# Patient Record
Sex: Female | Born: 1945 | Race: White | Hispanic: No | Smoking: Former smoker
Health system: Southern US, Community
[De-identification: ages and names within clinical notes are randomized; demographics above are authoritative.]

## PROBLEM LIST (undated history)

## (undated) ENCOUNTER — Ambulatory Visit: Admission: EM | Payer: Medicare Other | Source: Home / Self Care

## (undated) DIAGNOSIS — R51 Headache: Secondary | ICD-10-CM

## (undated) DIAGNOSIS — K219 Gastro-esophageal reflux disease without esophagitis: Secondary | ICD-10-CM

## (undated) DIAGNOSIS — F32A Depression, unspecified: Secondary | ICD-10-CM

## (undated) DIAGNOSIS — M1990 Unspecified osteoarthritis, unspecified site: Secondary | ICD-10-CM

## (undated) DIAGNOSIS — R519 Headache, unspecified: Secondary | ICD-10-CM

## (undated) DIAGNOSIS — E785 Hyperlipidemia, unspecified: Secondary | ICD-10-CM

## (undated) DIAGNOSIS — R110 Nausea: Secondary | ICD-10-CM

## (undated) DIAGNOSIS — F329 Major depressive disorder, single episode, unspecified: Secondary | ICD-10-CM

## (undated) DIAGNOSIS — D649 Anemia, unspecified: Secondary | ICD-10-CM

## (undated) HISTORY — PX: BLADDER SURGERY: SHX569

## (undated) HISTORY — PX: BREAST SURGERY: SHX581

## (undated) HISTORY — PX: TOTAL ABDOMINAL HYSTERECTOMY: SHX209

## (undated) HISTORY — PX: BIOPSY TONGUE: PRO39

## (undated) HISTORY — PX: ABDOMINAL HYSTERECTOMY: SHX81

## (undated) HISTORY — DX: Hyperlipidemia, unspecified: E78.5

---

## 2000-11-09 ENCOUNTER — Other Ambulatory Visit: Admission: RE | Admit: 2000-11-09 | Discharge: 2000-11-09 | Payer: Self-pay | Admitting: Obstetrics and Gynecology

## 2000-11-20 ENCOUNTER — Other Ambulatory Visit: Admission: RE | Admit: 2000-11-20 | Discharge: 2000-11-20 | Payer: Self-pay | Admitting: Urology

## 2000-11-24 ENCOUNTER — Ambulatory Visit (HOSPITAL_COMMUNITY): Admission: RE | Admit: 2000-11-24 | Discharge: 2000-11-24 | Payer: Self-pay | Admitting: Urology

## 2000-11-24 ENCOUNTER — Encounter: Payer: Self-pay | Admitting: Urology

## 2000-12-01 ENCOUNTER — Other Ambulatory Visit: Admission: RE | Admit: 2000-12-01 | Discharge: 2000-12-01 | Payer: Self-pay | Admitting: *Deleted

## 2002-03-21 ENCOUNTER — Encounter: Payer: Self-pay | Admitting: Obstetrics & Gynecology

## 2002-03-21 ENCOUNTER — Ambulatory Visit (HOSPITAL_COMMUNITY): Admission: RE | Admit: 2002-03-21 | Discharge: 2002-03-21 | Payer: Self-pay | Admitting: Obstetrics & Gynecology

## 2004-12-11 ENCOUNTER — Ambulatory Visit (HOSPITAL_COMMUNITY): Admission: RE | Admit: 2004-12-11 | Discharge: 2004-12-11 | Payer: Self-pay | Admitting: Obstetrics & Gynecology

## 2006-01-21 ENCOUNTER — Emergency Department (HOSPITAL_COMMUNITY): Admission: EM | Admit: 2006-01-21 | Discharge: 2006-01-21 | Payer: Self-pay | Admitting: Emergency Medicine

## 2006-07-20 ENCOUNTER — Emergency Department (HOSPITAL_COMMUNITY): Admission: EM | Admit: 2006-07-20 | Discharge: 2006-07-20 | Payer: Self-pay | Admitting: Emergency Medicine

## 2014-06-12 DIAGNOSIS — R439 Unspecified disturbances of smell and taste: Secondary | ICD-10-CM | POA: Diagnosis not present

## 2014-06-12 DIAGNOSIS — H9319 Tinnitus, unspecified ear: Secondary | ICD-10-CM | POA: Diagnosis not present

## 2014-06-12 DIAGNOSIS — H905 Unspecified sensorineural hearing loss: Secondary | ICD-10-CM | POA: Diagnosis not present

## 2014-06-12 DIAGNOSIS — R51 Headache: Secondary | ICD-10-CM | POA: Diagnosis not present

## 2014-06-15 DIAGNOSIS — R430 Anosmia: Secondary | ICD-10-CM | POA: Diagnosis not present

## 2014-06-15 DIAGNOSIS — H9319 Tinnitus, unspecified ear: Secondary | ICD-10-CM | POA: Diagnosis not present

## 2014-06-15 DIAGNOSIS — R930 Abnormal findings on diagnostic imaging of skull and head, not elsewhere classified: Secondary | ICD-10-CM | POA: Diagnosis not present

## 2014-06-15 DIAGNOSIS — R51 Headache: Secondary | ICD-10-CM | POA: Diagnosis not present

## 2014-06-29 DIAGNOSIS — R439 Unspecified disturbances of smell and taste: Secondary | ICD-10-CM | POA: Diagnosis not present

## 2014-06-29 DIAGNOSIS — H905 Unspecified sensorineural hearing loss: Secondary | ICD-10-CM | POA: Diagnosis not present

## 2014-06-29 DIAGNOSIS — H9319 Tinnitus, unspecified ear: Secondary | ICD-10-CM | POA: Diagnosis not present

## 2015-03-13 DIAGNOSIS — R432 Parageusia: Secondary | ICD-10-CM | POA: Diagnosis not present

## 2015-03-13 DIAGNOSIS — Z1389 Encounter for screening for other disorder: Secondary | ICD-10-CM | POA: Diagnosis not present

## 2015-03-13 DIAGNOSIS — E782 Mixed hyperlipidemia: Secondary | ICD-10-CM | POA: Diagnosis not present

## 2015-03-13 DIAGNOSIS — Z6822 Body mass index (BMI) 22.0-22.9, adult: Secondary | ICD-10-CM | POA: Diagnosis not present

## 2015-03-13 DIAGNOSIS — I10 Essential (primary) hypertension: Secondary | ICD-10-CM | POA: Diagnosis not present

## 2015-03-13 DIAGNOSIS — R430 Anosmia: Secondary | ICD-10-CM | POA: Diagnosis not present

## 2015-04-28 DIAGNOSIS — H2513 Age-related nuclear cataract, bilateral: Secondary | ICD-10-CM | POA: Diagnosis not present

## 2016-01-20 ENCOUNTER — Emergency Department (HOSPITAL_COMMUNITY): Payer: Medicare Other

## 2016-01-20 ENCOUNTER — Encounter (HOSPITAL_COMMUNITY): Payer: Self-pay | Admitting: Emergency Medicine

## 2016-01-20 ENCOUNTER — Emergency Department (HOSPITAL_COMMUNITY)
Admission: EM | Admit: 2016-01-20 | Discharge: 2016-01-20 | Disposition: A | Discharge status: Home / Self Care | Payer: Medicare Other | Attending: Emergency Medicine | Admitting: Emergency Medicine

## 2016-01-20 DIAGNOSIS — R079 Chest pain, unspecified: Secondary | ICD-10-CM | POA: Diagnosis not present

## 2016-01-20 DIAGNOSIS — R0789 Other chest pain: Secondary | ICD-10-CM | POA: Diagnosis not present

## 2016-01-20 DIAGNOSIS — I10 Essential (primary) hypertension: Secondary | ICD-10-CM | POA: Diagnosis not present

## 2016-01-20 DIAGNOSIS — R0781 Pleurodynia: Secondary | ICD-10-CM

## 2016-01-20 DIAGNOSIS — Z87891 Personal history of nicotine dependence: Secondary | ICD-10-CM | POA: Insufficient documentation

## 2016-01-20 DIAGNOSIS — R9431 Abnormal electrocardiogram [ECG] [EKG]: Secondary | ICD-10-CM | POA: Diagnosis not present

## 2016-01-20 HISTORY — DX: Major depressive disorder, single episode, unspecified: F32.9

## 2016-01-20 HISTORY — DX: Depression, unspecified: F32.A

## 2016-01-20 MED ORDER — IBUPROFEN 800 MG PO TABS
ORAL_TABLET | ORAL | Status: AC
  Administered 2016-01-20: 800 mg via ORAL
  Filled 2016-01-20: qty 1

## 2016-01-20 MED ORDER — IBUPROFEN 800 MG PO TABS
800.0000 mg | ORAL_TABLET | Freq: Once | ORAL | Status: AC
  Administered 2016-01-20: 800 mg via ORAL

## 2016-01-20 NOTE — ED Triage Notes (Signed)
Pt reports left rib pain x1 week. Pt daughter reports that pt's husband passed away one week ago and pt leaned over on left side frequently holding deceased husband's hand. Pt also helped pick husband up from a fall 3 days prior to his death.  PT denies n/v/d/urinary sx/fevers.  PT reports it hurts worse when breathing in.

## 2016-01-20 NOTE — ED Notes (Signed)
MD at bedside. 

## 2016-01-20 NOTE — ED Notes (Signed)
Patient with no complaints at this time. Respirations even and unlabored. Skin warm/dry. Discharge instructions reviewed with patient at this time. Patient given opportunity to voice concerns/ask questions. Patient discharged at this time and left Emergency Department with steady gait.   

## 2016-01-20 NOTE — ED Notes (Signed)
Patient requesting ibuprofen prior to discharge. MD aware and orders obtained.

## 2016-01-20 NOTE — Discharge Instructions (Signed)
Motrin for pain  ,Please obtain all of your results from medical records or have your doctors office obtain the results - share them with your doctor - you should be seen at your doctors office in the next 2 days. Call today to arrange your follow up. Take the medications as prescribed. Please review all of the medicines and only take them if you do not have an allergy to them. Please be aware that if you are taking birth control pills, taking other prescriptions, ESPECIALLY ANTIBIOTICS may make the birth control ineffective - if this is the case, either do not engage in sexual activity or use alternative methods of birth control such as condoms until you have finished the medicine and your family doctor says it is OK to restart them. If you are on a blood thinner such as COUMADIN, be aware that any other medicine that you take may cause the coumadin to either work too much, or not enough - you should have your coumadin level rechecked in next 7 days if this is the case.  ?  It is also a possibility that you have an allergic reaction to any of the medicines that you have been prescribed - Everybody reacts differently to medications and while MOST people have no trouble with most medicines, you may have a reaction such as nausea, vomiting, rash, swelling, shortness of breath. If this is the case, please stop taking the medicine immediately and contact your physician.  ?  You should return to the ER if you develop severe or worsening symptoms.

## 2016-01-20 NOTE — ED Notes (Signed)
Patient denies pain at rest and is resting comfortably.

## 2016-01-20 NOTE — ED Notes (Signed)
Took BC powder at home last at 0900

## 2016-01-20 NOTE — ED Provider Notes (Signed)
[facility] DEPT Provider Note   CSN: [id number] Arrival date & time: 01/20/16  1024  By signing my name below, I, Dolores Hoose, attest that this documentation has been prepared under the direction and in the presence of Noemi Chapel, MD . Electronically Signed: Dolores Hoose, Scribe. 01/20/2016. 10:51 AM.  History   Chief Complaint Chief Complaint  Patient presents with  . Rib pain   The history is provided by the patient and a relative. No language interpreter was used.    HPI Comments:  Jocelyn Love is a 70 y.o. female with PMHx of HTN and HLD who presents to the Emergency Department complaining of waxing/waning constant left-sided rib pain onset one week. Her pain is worsened by movement and forced inhalation. Her daughter reports the Pt has recently been leaning over the side of a hospital bed for extended periods of time while the Pt's husband was in hospice. Pt reports she took some pain medication this morning with mild relief. She notes associated mid-lower back pain. Pt denies coughing, fevers, chills, swelling, vomiting, diarrhea or PMHx of heart problems. Pt has a history of smoking but quit smoking 20 years ago.     Past Medical History:  Diagnosis Date  . Depression   . Hyperlipidemia   . Hypertension     There are no active problems to display for this patient.   Past Surgical History:  Procedure Laterality Date  . ABDOMINAL HYSTERECTOMY    . BLADDER SURGERY    . BREAST SURGERY      OB History    No data available       Home Medications    Prior to Admission medications   Medication Sig Start Date End Date Taking? Authorizing Provider  ALPRAZolam Duanne Moron) 0.5 MG tablet Take 1 tablet by mouth 2 (two) times daily as needed. 01/18/16  Yes Historical Provider, MD  amantadine (SYMMETREL) 100 MG capsule Take 1 capsule by mouth daily. 01/18/16  Yes Historical Provider, MD  Aspirin-Salicylamide-Caffeine (BC HEADACHE POWDER PO) Take 1 packet by mouth daily  as needed (pain).   Yes Historical Provider, MD  DULoxetine (CYMBALTA) 60 MG capsule Take 1 capsule by mouth daily. 01/18/16  Yes Historical Provider, MD  risperiDONE (RISPERDAL) 0.5 MG tablet Take 0.5 mg by mouth at bedtime.   Yes Historical Provider, MD    Family History History reviewed. No pertinent family history.  Social History Social History  Substance Use Topics  . Smoking status: Former Research scientist (life sciences)  . Smokeless tobacco: Never Used  . Alcohol use No     Allergies   Codeine and Sulfa antibiotics   Review of Systems Review of Systems  Constitutional: Negative for chills and fever.  Respiratory: Negative for cough.   Cardiovascular: Positive for chest pain. Negative for leg swelling.  Gastrointestinal: Negative for diarrhea and vomiting.  Musculoskeletal: Positive for back pain.  All other systems reviewed and are negative.    Physical Exam Updated Vital Signs BP 146/75   Pulse 70   Temp 97.9 F (36.6 C) (Oral)   Resp 16   Ht 5\' 3"  (1.6 m)   Wt 125 lb (56.7 kg)   SpO2 100%   BMI 22.14 kg/m   Physical Exam  Constitutional: She appears well-developed and well-nourished. No distress.  HENT:  Head: Normocephalic and atraumatic.  Mouth/Throat: Oropharynx is clear and moist. No oropharyngeal exudate.  Eyes: Conjunctivae and EOM are normal. Pupils are equal, round, and reactive to light. Right eye exhibits no discharge. Left eye  exhibits no discharge. No scleral icterus.  Neck: Normal range of motion. Neck supple. No JVD present. No thyromegaly present.  Cardiovascular: Normal rate, regular rhythm, normal heart sounds and intact distal pulses.  Exam reveals no gallop and no friction rub.   No murmur heard. Pulmonary/Chest: Effort normal and breath sounds normal. No respiratory distress. She has no wheezes. She has no rales.  Abdominal: Soft. Bowel sounds are normal. She exhibits no distension and no mass. There is no tenderness.  Musculoskeletal: Normal range of  motion. She exhibits no edema or tenderness.  Pain at left costal margin and mid clavicular line.   Lymphadenopathy:    She has no cervical adenopathy.  Neurological: She is alert. Coordination normal.  Skin: Skin is warm and dry. No rash noted. No erythema.  Psychiatric: She has a normal mood and affect. Her behavior is normal.  Nursing note and vitals reviewed.    ED Treatments / Results  DIAGNOSTIC STUDIES:  Oxygen Saturation is 98% on RA, normal by my interpretation.    COORDINATION OF CARE:  10:56 AM Discussed treatment plan with pt at bedside which included CXR and EKG and pt agreed to plan.  Labs (all labs ordered are listed, but only abnormal results are displayed) Labs Reviewed - No data to display  EKG  EKG Interpretation  Date/Time:  Sunday January 20 2016 11:07:08 EDT Ventricular Rate:  67 PR Interval:    QRS Duration: 103 QT Interval:  420 QTC Calculation: 444 R Axis:   -90 Text Interpretation:  Sinus rhythm Left anterior fascicular block Abnormal R-wave progression, late transition No old tracing to compare Abnormal ekg Confirmed by Sabra Heck  MD, Adrianah Prophete ([id number]) on 01/20/2016 11:26:28 AM       Radiology Dg Chest 2 View  Result Date: 01/20/2016 CLINICAL DATA:  Left side chest pain EXAM: CHEST  2 VIEW COMPARISON:  None. FINDINGS: Cardiomediastinal silhouette is unremarkable. No infiltrate or pleural effusion. No pulmonary edema. Thoracic spine osteopenia. Bilateral breast implants are noted. Mild lower thoracic levoscoliosis. IMPRESSION: No active cardiopulmonary disease. Electronically Signed   By: Lahoma Crocker M.D.   On: 01/20/2016 11:25    Procedures Procedures (including critical care time)  Medications Ordered in ED Medications - No data to display   Initial Impression / Assessment and Plan / ED Course  I have reviewed the triage vital signs and the nursing notes.  Pertinent labs & imaging results that were available during my care of the patient were  reviewed by me and considered in my medical decision making (see chart for details).  Clinical Course  Comment By Time  The patient has no abnormal findings on exam, vital signs show mild hypertension, EKG and chest x-ray ordered to rule out rib injury, pneumothorax or infiltrate. The pain seems to be noncardiogenic however the patient is a prior smoker, her pain does not seem to be getting worse with palpation. The daughter does note that this was something that came on after the patient's husband died this week, she has been at his bedside in hospice leaning over a bed rail through the week Noemi Chapel, MD 08/20 1056  Discussed findings with the pt including the abnormal ECG - CXR without acute findings - VS normal - stable for d/c.  She will f/u outpatient - given copy of her EKG for f/u. Noemi Chapel, MD 08/20 1213      Final Clinical Impressions(s) / ED Diagnoses   Final diagnoses:  Rib pain on left side  Abnormal  EKG    New Prescriptions New Prescriptions   No medications on file    I personally performed the services described in this documentation, which was scribed in my presence. The recorded information has been reviewed and is accurate.        Noemi Chapel, MD 01/20/16 [phone]

## 2016-01-28 DIAGNOSIS — I10 Essential (primary) hypertension: Secondary | ICD-10-CM | POA: Diagnosis not present

## 2016-01-28 DIAGNOSIS — Z1389 Encounter for screening for other disorder: Secondary | ICD-10-CM | POA: Diagnosis not present

## 2016-01-28 DIAGNOSIS — R7309 Other abnormal glucose: Secondary | ICD-10-CM | POA: Diagnosis not present

## 2016-01-28 DIAGNOSIS — Z6822 Body mass index (BMI) 22.0-22.9, adult: Secondary | ICD-10-CM | POA: Diagnosis not present

## 2016-01-28 DIAGNOSIS — E782 Mixed hyperlipidemia: Secondary | ICD-10-CM | POA: Diagnosis not present

## 2016-06-27 DIAGNOSIS — E559 Vitamin D deficiency, unspecified: Secondary | ICD-10-CM | POA: Diagnosis not present

## 2016-06-27 DIAGNOSIS — Z1389 Encounter for screening for other disorder: Secondary | ICD-10-CM | POA: Diagnosis not present

## 2016-06-27 DIAGNOSIS — Z23 Encounter for immunization: Secondary | ICD-10-CM | POA: Diagnosis not present

## 2016-06-27 DIAGNOSIS — Z6822 Body mass index (BMI) 22.0-22.9, adult: Secondary | ICD-10-CM | POA: Diagnosis not present

## 2016-08-26 DIAGNOSIS — Z6822 Body mass index (BMI) 22.0-22.9, adult: Secondary | ICD-10-CM | POA: Diagnosis not present

## 2016-10-21 DIAGNOSIS — Z6822 Body mass index (BMI) 22.0-22.9, adult: Secondary | ICD-10-CM | POA: Diagnosis not present

## 2016-10-21 DIAGNOSIS — R110 Nausea: Secondary | ICD-10-CM | POA: Diagnosis not present

## 2016-10-30 DIAGNOSIS — Z6822 Body mass index (BMI) 22.0-22.9, adult: Secondary | ICD-10-CM | POA: Diagnosis not present

## 2016-11-01 DIAGNOSIS — Z1211 Encounter for screening for malignant neoplasm of colon: Secondary | ICD-10-CM | POA: Diagnosis not present

## 2016-11-04 ENCOUNTER — Encounter: Payer: Self-pay | Admitting: Internal Medicine

## 2016-11-11 ENCOUNTER — Encounter: Payer: Self-pay | Admitting: Gastroenterology

## 2016-11-11 ENCOUNTER — Ambulatory Visit (INDEPENDENT_AMBULATORY_CARE_PROVIDER_SITE_OTHER): Payer: Medicare Other | Admitting: Gastroenterology

## 2016-11-11 ENCOUNTER — Other Ambulatory Visit: Payer: Self-pay

## 2016-11-11 ENCOUNTER — Telehealth: Payer: Self-pay

## 2016-11-11 VITALS — BP 125/77 | HR 75 | Temp 98.0°F | Ht 63.0 in | Wt 126.6 lb

## 2016-11-11 DIAGNOSIS — D649 Anemia, unspecified: Secondary | ICD-10-CM

## 2016-11-11 DIAGNOSIS — K146 Glossodynia: Secondary | ICD-10-CM | POA: Diagnosis not present

## 2016-11-11 DIAGNOSIS — Z1211 Encounter for screening for malignant neoplasm of colon: Secondary | ICD-10-CM

## 2016-11-11 DIAGNOSIS — R112 Nausea with vomiting, unspecified: Secondary | ICD-10-CM | POA: Insufficient documentation

## 2016-11-11 DIAGNOSIS — D509 Iron deficiency anemia, unspecified: Secondary | ICD-10-CM | POA: Diagnosis not present

## 2016-11-11 DIAGNOSIS — R7989 Other specified abnormal findings of blood chemistry: Secondary | ICD-10-CM | POA: Diagnosis not present

## 2016-11-11 DIAGNOSIS — R110 Nausea: Secondary | ICD-10-CM

## 2016-11-11 MED ORDER — PEG 3350-KCL-NA BICARB-NACL 420 G PO SOLR: 4000.0000 mL | ORAL | 0 refills | Status: DC

## 2016-11-11 NOTE — Telephone Encounter (Signed)
Pt was seen in office this morning by LSL. She called office and LMOVM. She realized after she got home that she already has an ENT appt and said there was no need for our office to do an ENT referral. Routing to LSL as FYI.

## 2016-11-11 NOTE — Patient Instructions (Signed)
PA info for TCS/EGD/+/-ED submitted via [facility] website. No PA needed. Decision ID# [id number].

## 2016-11-11 NOTE — Progress Notes (Addendum)
REVIEWED-NO ADDITIONAL RECOMMENDATIONS.  Primary Care Physician:  Sharilyn Sites, MD  Primary Gastroenterologist:  Barney Drain, MD   Chief Complaint  Patient presents with  . Nausea    HPI:  Jocelyn Love is a 71 y.o. female here at the request of PCP for chronic nausea needing an EGD and screening colonoscopy. Patient presents with her daughter-in-law, Inocencia Murtaugh. Patient reports nausea with intermittent vomiting for several months. She denies associated weight loss. Symptoms have happened several times while standing in line at the grocery store. Nausea usually associated with dizziness. Moving around seems to aggravate her symptoms. Sitting down seems to help. Over the last 4 months she's had approximate 5 days where she did not feel nauseated. She has tried Alka-Seltzer, Tums, milk of magnesia. She has reduced her diet to very bland foods. No relief. She also describes sores that come up on her tongue and in her mouth. She has a couple now that is been persistent the whole time. Very tender. 3 weeks ago she was started on pantoprazole 40 mg daily but really has not noticed any improvement. In the past she's been on omeprazole but not lately. She takes BC powders at least 4 daily for headache. She has been on this chronically. She reports dysphagia years ago but improved on omeprazole. She states she has a hernia in her stomach. Denies abdominal pain. Bowel function regular. Denies melena rectal bleeding. Recent medication changes include discontinuing risperidone and Cymbalta, starting Zoloft May 30, symptoms were present weight before that.  She reports turning in a Hemoccult for her PCP. We requested records which are uninterpretable. We are awaiting clarification.  Current Outpatient Prescriptions  Medication Sig Dispense Refill  . Aspirin-Salicylamide-Caffeine (BC HEADACHE POWDER PO) Take 1 packet by mouth daily as needed (pain).    . cholecalciferol (VITAMIN D) 1000 units  tablet Take 1,000 Units by mouth daily.    . pantoprazole (PROTONIX) 40 MG tablet Take 40 mg by mouth daily.    . sertraline (ZOLOFT) 50 MG tablet Take 50 mg by mouth daily.    . vitamin B-12 (CYANOCOBALAMIN) 1000 MCG tablet Take 1,000 mcg by mouth daily.     No current facility-administered medications for this visit.     Allergies as of 11/11/2016 - Review Complete 11/11/2016  Allergen Reaction Noted  . Codeine  01/20/2016  . Sulfa antibiotics  01/20/2016    Past Medical History:  Diagnosis Date  . Depression     Past Surgical History:  Procedure Laterality Date  . ABDOMINAL HYSTERECTOMY    . BLADDER SURGERY    . BREAST SURGERY     augmentation    Family History  Problem Relation Age of Onset  . Colon cancer Neg Hx     Social History   Social History  . Marital status: Widowed    Spouse name: N/A  . Number of children: N/A  . Years of education: N/A   Occupational History  . Not on file.   Social History Main Topics  . Smoking status: Former Research scientist (life sciences)  . Smokeless tobacco: Never Used  . Alcohol use No  . Drug use: No  . Sexual activity: Not on file   Other Topics Concern  . Not on file   Social History Narrative  . No narrative on file      ROS:  General: Negative for anorexia, weight loss, fever, chills, fatigue, weakness. Eyes: Negative for vision changes.  ENT: Negative for hoarseness, difficulty swallowing , nasal congestion. CV: Negative  for chest pain, angina, palpitations, dyspnea on exertion, peripheral edema.  Respiratory: Negative for dyspnea at rest, dyspnea on exertion, cough, sputum, wheezing.  GI: See history of present illness. GU:  Negative for dysuria, hematuria, urinary incontinence, urinary frequency, nocturnal urination.  MS: Negative for joint pain, low back pain.  Derm: Negative for rash or itching.  Neuro: Negative for weakness, abnormal sensation, seizure, frequent headaches, memory loss, confusion. See history of present  illness Psych: Negative for anxiety, depression, suicidal ideation, hallucinations.  Endo: Negative for unusual weight change.  Heme: Negative for bruising or bleeding. Allergy: Negative for rash or hives.    Physical Examination:  BP 125/77   Pulse 75   Temp 98 F (36.7 C) (Oral)   Ht 5\' 3"  (1.6 m)   Wt 126 lb 9.6 oz (57.4 kg)   BMI 22.43 kg/m    General: Well-nourished, well-developed in no acute distress.  Head: Normocephalic, atraumatic.   Eyes: Conjunctiva pink, no icterus. Mouth: Oropharyngeal mucosa moist and pink , no  erythema or exudate. Small white sore on right lateral aspect of tongue. Neck: Supple without thyromegaly, masses, or lymphadenopathy.  Lungs: Clear to auscultation bilaterally.  Heart: Regular rate and rhythm, no murmurs rubs or gallops.  Abdomen: Bowel sounds are normal, nontender, nondistended, no hepatosplenomegaly or masses, no abdominal bruits or    hernia , no rebound or guarding.   Rectal: Not performed Extremities: No lower extremity edema. No clubbing or deformities.  Neuro: Alert and oriented x 4 , grossly normal neurologically.  Skin: Warm and dry, no rash or jaundice.   Psych: Alert and cooperative, normal mood and affect.  Imaging Studies: No results found.

## 2016-11-11 NOTE — Patient Instructions (Signed)
1. Upper endoscopy and colonoscopy as scheduled. See separate instructions.  2. Please have labs done within the next 1-2 days at [facility] beside [facility].  3. ENT referral for sores on your tongue.  4. Continue pantoprazole daily for now, 30 minutes before first meal of the day. 5. Cut back on aspirin powder use as much as possible.

## 2016-11-11 NOTE — Telephone Encounter (Signed)
NOTED

## 2016-11-12 LAB — CBC WITH DIFFERENTIAL/PLATELET
BASOS ABS: 0 10*3/uL (ref 0.0–0.2)
BASOS: 0 %
EOS (ABSOLUTE): 0.2 10*3/uL (ref 0.0–0.4)
Eos: 2 %
HEMATOCRIT: 27 % — AB (ref 34.0–46.6)
Hemoglobin: 8.2 g/dL — ABNORMAL LOW (ref 11.1–15.9)
IMMATURE GRANS (ABS): 0 10*3/uL (ref 0.0–0.1)
Immature Granulocytes: 0 %
LYMPHS: 36 %
Lymphocytes Absolute: 3.8 10*3/uL — ABNORMAL HIGH (ref 0.7–3.1)
MCH: 20.6 pg — AB (ref 26.6–33.0)
MCHC: 30.4 g/dL — ABNORMAL LOW (ref 31.5–35.7)
MCV: 68 fL — AB (ref 79–97)
MONOCYTES: 7 %
Monocytes Absolute: 0.7 10*3/uL (ref 0.1–0.9)
NEUTROS ABS: 5.7 10*3/uL (ref 1.4–7.0)
Neutrophils: 55 %
Platelets: 505 10*3/uL — ABNORMAL HIGH (ref 150–379)
RBC: 3.99 x10E6/uL (ref 3.77–5.28)
RDW: 17 % — ABNORMAL HIGH (ref 12.3–15.4)
WBC: 10.5 10*3/uL (ref 3.4–10.8)

## 2016-11-12 LAB — COMPREHENSIVE METABOLIC PANEL
A/G RATIO: 1.7 (ref 1.2–2.2)
ALBUMIN: 4.3 g/dL (ref 3.5–4.8)
ALT: 9 IU/L (ref 0–32)
AST: 15 IU/L (ref 0–40)
Alkaline Phosphatase: 69 IU/L (ref 39–117)
BUN/Creatinine Ratio: 16 (ref 12–28)
BUN: 14 mg/dL (ref 8–27)
CHLORIDE: 107 mmol/L — AB (ref 96–106)
CO2: 16 mmol/L — ABNORMAL LOW (ref 20–29)
Calcium: 9 mg/dL (ref 8.7–10.3)
Creatinine, Ser: 0.85 mg/dL (ref 0.57–1.00)
GFR calc non Af Amer: 70 mL/min/{1.73_m2} (ref >59)
GFR, EST AFRICAN AMERICAN: 80 mL/min/{1.73_m2} (ref >59)
Globulin, Total: 2.6 g/dL (ref 1.5–4.5)
Glucose: 89 mg/dL (ref 65–99)
POTASSIUM: 4.6 mmol/L (ref 3.5–5.2)
Sodium: 144 mmol/L (ref 134–144)
TOTAL PROTEIN: 6.9 g/dL (ref 6.0–8.5)

## 2016-11-12 LAB — TSH: TSH: 1.57 u[IU]/mL (ref 0.450–4.500)

## 2016-11-12 NOTE — Progress Notes (Signed)
Please add on iron/tibc, ferritin.  Please let patient know that her hemoglobin is significantly low, likely iron deficient. LFTs, TSH normal.  I need her to stop ASA powders. She is likely losing blood from GI tract and this will make it worse.  Continue the stomach pill, pantoprazole daily.  I need results from ifobt that she turned in to PCP. If she feels short of breath, lightheaded she should let us know. She may require transfusion if she has those symptoms.   NEED TO MOVE UP HER TCS/EGD/ED AS SOON AS POSSIBLE.

## 2016-11-13 NOTE — Progress Notes (Signed)
PT's daughter returned call and was informed since she is on pt's list to be told results. Jeani Hawking was not aware of any recent test at PCP to check for blood. She thought it was last done 1-2 years ago. I will fax a request to Dr. Hilma Favors to find out. She is aware labs have ben added and the other information and that we will be moving the procedures up some.  Forwarding to [facility] to move the procedures up ASAP.

## 2016-11-13 NOTE — Progress Notes (Signed)
I faxed request to Dr. Hilma Favors for iFOBT results.

## 2016-11-13 NOTE — Progress Notes (Signed)
Tried to call pt. Not accepting calls at this time. I called [facility] and spoke to [employer] and had the iron/tibc and ferritin added.

## 2016-11-13 NOTE — Progress Notes (Signed)
Called emergency contact ( daughter, Glory Buff) and asked her to have her mom call me.

## 2016-11-14 NOTE — Assessment & Plan Note (Signed)
71 year old female who presents with several month history of chronic nausea with intermittent vomiting, sores on the tongue, no typical heartburn. She takes at least 4 BC powders daily. Would be concerned about peptic ulcer disease. Plan on labs. Requested patient to cut way back on her [facility] powders with goal of stopping. Continue pantoprazole 40 mg daily. Upper endoscopy in the near future.  I have discussed the risks, alternatives, benefits with regards to but not limited to the risk of reaction to medication, bleeding, infection, perforation and the patient is agreeable to proceed. Written consent to be obtained.

## 2016-11-14 NOTE — Assessment & Plan Note (Signed)
Plan on screening colonoscopy in the near future.  I have discussed the risks, alternatives, benefits with regards to but not limited to the risk of reaction to medication, bleeding, infection, perforation and the patient is agreeable to proceed. Written consent to be obtained.

## 2016-11-14 NOTE — Assessment & Plan Note (Signed)
Patient complains a several month history of sores on her tongue, persisting sores. The same ones have been present for several months. Would recommend ENT referral. She reports that due to Magic mixture did not help. No evidence of yeast.

## 2016-11-14 NOTE — Progress Notes (Signed)
cc'ed to pcp °

## 2016-11-17 ENCOUNTER — Telehealth: Payer: Self-pay

## 2016-11-17 NOTE — Telephone Encounter (Signed)
LMOM for a return call from [facility].

## 2016-11-17 NOTE — Telephone Encounter (Signed)
Ferritin and iron are low. She has IDA.   Please address with Jocelyn Love tomorrow. If she is feeling worse than normal, need to check CBC.

## 2016-11-17 NOTE — Telephone Encounter (Signed)
Jocelyn Love returned call and is aware. She said her mom has over done herself and done more than normal. She feels she is just very tired.  She will observe and let us know if she worsens. Forwarding to Neil Crouch, PA to advise for tomorrow.

## 2016-11-17 NOTE — Telephone Encounter (Signed)
Pt's daughter, Glory Buff called to see if results of the iron/ferritin has been received. She said her mom is just feeling dizzy and is just tired of the day after day feeling. They would like the results on the labs that were added and so they want to know if there is a plan for pt to have transfusion before the procedures at the end of June. Please advise!  ( Her call back numbers are [phone] and [phone].  Forwarding to Roseanne Kaufman, NP since Neil Crouch, PA had left for the day.

## 2016-11-18 LAB — FERRITIN: Ferritin: 3 ng/mL — ABNORMAL LOW (ref 15–150)

## 2016-11-18 LAB — IRON AND TIBC
IRON SATURATION: 3 % — AB (ref 15–55)
IRON: 13 ug/dL — AB (ref 27–139)
Total Iron Binding Capacity: 441 ug/dL (ref 250–450)
UIBC: 428 ug/dL — ABNORMAL HIGH (ref 118–369)

## 2016-11-18 LAB — SPECIMEN STATUS REPORT

## 2016-11-18 NOTE — Telephone Encounter (Signed)
Paperwork faxed to the hospital.

## 2016-11-18 NOTE — Telephone Encounter (Signed)
Let's plan for one unit of prbcs if patient agreeable. Need in the next 1-2 days.   Should have H/H before transfusion (when they type and cross her blood).   Patient needs to take benadryl 25mg  and tylenol 500mg  one hour before transfusion.

## 2016-11-18 NOTE — Telephone Encounter (Signed)
Pt's daughter Jeani Hawking is aware and said that would be fine with the pt. Said she is willing to do anything to feel better.

## 2016-11-19 ENCOUNTER — Encounter (HOSPITAL_COMMUNITY)
Admission: RE | Admit: 2016-11-19 | Discharge: 2016-11-19 | Disposition: A | Discharge status: Home / Self Care | Payer: Medicare Other | Source: Ambulatory Visit | Attending: Gastroenterology | Admitting: Gastroenterology

## 2016-11-19 ENCOUNTER — Telehealth: Payer: Self-pay

## 2016-11-19 DIAGNOSIS — D509 Iron deficiency anemia, unspecified: Secondary | ICD-10-CM | POA: Insufficient documentation

## 2016-11-19 LAB — ABO/RH: ABO/RH(D): A POS

## 2016-11-19 LAB — HEMOGLOBIN AND HEMATOCRIT, BLOOD
HCT: 31 % — ABNORMAL LOW (ref 36.0–46.0)
HEMOGLOBIN: 9.1 g/dL — AB (ref 12.0–15.0)

## 2016-11-19 LAB — PREPARE RBC (CROSSMATCH)

## 2016-11-19 NOTE — Progress Notes (Signed)
Severe IDA. Plan for one unit prbcs. Procedures as scheduled. Patient aware.

## 2016-11-19 NOTE — Progress Notes (Signed)
Neil Crouch notified of Hgb 9.1/Hct 31.0 per Waldon Merl LPN. L Lewis verified to proceed with transfusion of  1 unit PRBC's tomorrow. Will proceed.

## 2016-11-19 NOTE — Progress Notes (Signed)
Blood drawn and sent to lab for processing for blood transfusion.

## 2016-11-19 NOTE — Progress Notes (Signed)
Results for Jocelyn Love, Jocelyn Love (MRN [id number]) as of 11/19/2016 14:16  Ref. Range 11/19/2016 12:05  Hemoglobin Latest Ref Range: 12.0 - 15.0 g/dL 9.1 (L)  HCT Latest Ref Range: 36.0 - 46.0 % 31.0 (L)

## 2016-11-19 NOTE — Telephone Encounter (Signed)
T/C from [place] at [facility]. Pt came in for type and cross today. Hemoglobin 9.1 and Hematacrit 31, does provider still want to proceed with transfusion. I spoke to Neil Crouch, PA, who ordered and she said since pt has been symptomatic and procedure is about a week away, Ok to proceed to allow some reserve for pt. Jacqlyn Larsen is aware of the plan.

## 2016-11-20 ENCOUNTER — Encounter (HOSPITAL_COMMUNITY)
Admission: RE | Admit: 2016-11-20 | Discharge: 2016-11-20 | Disposition: A | Discharge status: Home / Self Care | Payer: Medicare Other | Source: Ambulatory Visit | Attending: Gastroenterology | Admitting: Gastroenterology

## 2016-11-20 ENCOUNTER — Encounter (HOSPITAL_COMMUNITY): Payer: Self-pay

## 2016-11-20 DIAGNOSIS — D509 Iron deficiency anemia, unspecified: Secondary | ICD-10-CM | POA: Diagnosis not present

## 2016-11-20 MED ORDER — ACETAMINOPHEN 500 MG PO TABS
500.0000 mg | ORAL_TABLET | Freq: Once | ORAL | Status: AC
  Administered 2016-11-20: 500 mg via ORAL

## 2016-11-20 MED ORDER — SODIUM CHLORIDE 0.9 % IV SOLN
Freq: Once | INTRAVENOUS | Status: AC
  Administered 2016-11-20: 250 mL via INTRAVENOUS

## 2016-11-20 MED ORDER — DIPHENHYDRAMINE HCL 25 MG PO CAPS
25.0000 mg | ORAL_CAPSULE | Freq: Once | ORAL | Status: AC
  Administered 2016-11-20: 25 mg via ORAL

## 2016-11-20 MED ORDER — DIPHENHYDRAMINE HCL 25 MG PO CAPS
ORAL_CAPSULE | ORAL | Status: AC
  Filled 2016-11-20: qty 1

## 2016-11-20 MED ORDER — ACETAMINOPHEN 500 MG PO TABS
ORAL_TABLET | ORAL | Status: AC
  Filled 2016-11-20: qty 1

## 2016-11-20 NOTE — Discharge Instructions (Signed)
Blood Transfusion, Care After This sheet gives you information about how to care for yourself after your procedure. Your doctor may also give you more specific instructions. If you have problems or questions, contact your doctor. Follow these instructions at home:  Take over-the-counter and prescription medicines only as told by your doctor.  Go back to your normal activities as told by your doctor.  Follow instructions from your doctor about how to take care of the area where an IV tube was put into your vein (insertion site). Make sure you: ? Wash your hands with soap and water before you change your bandage (dressing). If there is no soap and water, use hand sanitizer. ? Change your bandage as told by your doctor.  Check your IV insertion site every day for signs of infection. Check for: ? More redness, swelling, or pain. ? More fluid or blood. ? Warmth. ? Pus or a bad smell. Contact a doctor if:  You have more redness, swelling, or pain around the IV insertion site..  You have more fluid or blood coming from the IV insertion site.  Your IV insertion site feels warm to the touch.  You have pus or a bad smell coming from the IV insertion site.  Your pee (urine) turns pink, red, or brown.  You feel weak after doing your normal activities. Get help right away if:  You have signs of a serious allergic or body defense (immune) system reaction, including: ? Itchiness. ? Hives. ? Trouble breathing. ? Anxiety. ? Pain in your chest or lower back. ? Fever, flushing, and chills. ? Fast pulse. ? Rash. ? Watery poop (diarrhea). ? Throwing up (vomiting). ? Dark pee. ? Serious headache. ? Dizziness. ? Stiff neck. ? Yellow color in your face or the white parts of your eyes (jaundice). Summary  After a blood transfusion, return to your normal activities as told by your doctor.  Every day, check for signs of infection where the IV tube was put into your vein.  Some signs of  infection are warm skin, more redness and pain, more fluid or blood, and pus or a bad smell where the needle went in.  Contact your doctor if you feel weak or have any unusual symptoms. This information is not intended to replace advice given to you by your health care provider. Make sure you discuss any questions you have with your health care provider. Document Released: 06/09/2014 Document Revised: 01/11/2016 Document Reviewed: 01/11/2016 Elsevier Interactive Patient Education  2017 [place] [employer]. Blood Transfusion A blood transfusion is a procedure in which you are given blood through an IV tube. You may need this procedure because of:  Illness.  Surgery.  Injury.  The blood may come from someone else (a donor). You may also be able to donate blood for yourself (autologous blood donation). The blood given in a transfusion is made up of different types of cells. You may get:  Red blood cells. These carry oxygen to the cells in the body.  White blood cells. These help you fight infections.  Platelets. These help your blood to clot.  Plasma. This is the liquid part of your blood. It helps with fluid imbalances.  If you have a clotting disorder, you may also get other types of blood products. What happens before the procedure?  You will have a blood test to find out your blood type. The test also finds out what type of blood your body will accept and matches it to the donor  type.  If you are going to have a planned surgery, you may be able to donate your own blood. This may be done in case you need a transfusion.  If you have had an allergic reaction to a transfusion in the past, you may be given medicine to help prevent a reaction. This medicine may be given to you by mouth or through an IV.  You will have your temperature, blood pressure, and pulse checked.  Follow instructions from your doctor about what you cannot eat or drink.  Ask your doctor about: ? Changing or stopping  your regular medicines. This is important if you take diabetes medicines or blood thinners. ? Taking medicines such as aspirin and ibuprofen. These medicines can thin your blood. Do not take these medicines before your procedure if your doctor tells you not to. What happens during the procedure?  An IV tube will be put into one of your veins.  The bag of donated blood will be attached to your IV tube. Then, the blood will enter through your vein.  Your temperature, blood pressure, and pulse will be checked regularly during the procedure. This is done to find early signs of a transfusion reaction.  If you have any signs or symptoms of a reaction, your transfusion will be stopped. You may also be given medicine.  When the transfusion is done, your IV tube will be taken out.  Pressure may be applied to the IV site for a few minutes.  A bandage (dressing) will be put on the IV site. The procedure may vary among doctors and hospitals. What happens after the procedure?  Your temperature, blood pressure, heart rate, breathing rate, and blood oxygen level will be checked often.  Your blood may be tested to see how you are responding to the transfusion.  You may be warmed with fluids or blankets. This is done to keep the temperature of your body normal. Summary  A blood transfusion is a procedure in which you are given blood through an IV tube.  The blood may come from someone else (a donor). You may also be able to donate blood for yourself.  If you have had an allergic reaction to a transfusion in the past, you may be given medicine to help prevent a reaction. This medicine may be given to you by mouth or through an IV tube.  Your temperature, blood pressure, heart rate, breathing rate, and blood oxygen level will be checked often.  Your blood may be tested to see how you are responding to the transfusion. This information is not intended to replace advice given to you by your health  care provider. Make sure you discuss any questions you have with your health care provider. Document Released: 08/15/2008 Document Revised: 01/11/2016 Document Reviewed: 01/11/2016 Elsevier Interactive Patient Education  2017 [employer].

## 2016-11-20 NOTE — Telephone Encounter (Signed)
Agree 

## 2016-11-21 LAB — TYPE AND SCREEN
ABO/RH(D): A POS
ANTIBODY SCREEN: NEGATIVE
Unit division: 0

## 2016-11-21 LAB — BPAM RBC
BLOOD PRODUCT EXPIRATION DATE: 201806272359
ISSUE DATE / TIME: 201806210926
UNIT TYPE AND RH: 6200

## 2016-11-23 ENCOUNTER — Other Ambulatory Visit: Payer: Self-pay

## 2016-11-23 ENCOUNTER — Emergency Department (HOSPITAL_COMMUNITY): Payer: Medicare Other

## 2016-11-23 ENCOUNTER — Observation Stay (HOSPITAL_COMMUNITY)
Admission: EM | Admit: 2016-11-23 | Discharge: 2016-11-25 | Disposition: A | Discharge status: Home / Self Care | Payer: Medicare Other | Attending: Internal Medicine | Admitting: Internal Medicine

## 2016-11-23 ENCOUNTER — Encounter (HOSPITAL_COMMUNITY): Payer: Self-pay | Admitting: Emergency Medicine

## 2016-11-23 DIAGNOSIS — I951 Orthostatic hypotension: Secondary | ICD-10-CM

## 2016-11-23 DIAGNOSIS — D509 Iron deficiency anemia, unspecified: Secondary | ICD-10-CM

## 2016-11-23 DIAGNOSIS — D649 Anemia, unspecified: Principal | ICD-10-CM | POA: Diagnosis present

## 2016-11-23 DIAGNOSIS — N390 Urinary tract infection, site not specified: Secondary | ICD-10-CM | POA: Diagnosis present

## 2016-11-23 DIAGNOSIS — Z79899 Other long term (current) drug therapy: Secondary | ICD-10-CM | POA: Insufficient documentation

## 2016-11-23 DIAGNOSIS — Z87891 Personal history of nicotine dependence: Secondary | ICD-10-CM | POA: Insufficient documentation

## 2016-11-23 DIAGNOSIS — R42 Dizziness and giddiness: Secondary | ICD-10-CM

## 2016-11-23 DIAGNOSIS — H8110 Benign paroxysmal vertigo, unspecified ear: Secondary | ICD-10-CM | POA: Diagnosis present

## 2016-11-23 DIAGNOSIS — F329 Major depressive disorder, single episode, unspecified: Secondary | ICD-10-CM | POA: Diagnosis present

## 2016-11-23 DIAGNOSIS — F32A Depression, unspecified: Secondary | ICD-10-CM | POA: Diagnosis present

## 2016-11-23 HISTORY — DX: Nausea: R11.0

## 2016-11-23 HISTORY — DX: Headache, unspecified: R51.9

## 2016-11-23 HISTORY — DX: Anemia, unspecified: D64.9

## 2016-11-23 HISTORY — DX: Headache: R51

## 2016-11-23 LAB — DIFFERENTIAL
Basophils Absolute: 0 10*3/uL (ref 0.0–0.1)
Basophils Relative: 0 %
Eosinophils Absolute: 0.2 10*3/uL (ref 0.0–0.7)
Eosinophils Relative: 2 %
LYMPHS ABS: 4.1 10*3/uL — AB (ref 0.7–4.0)
LYMPHS PCT: 34 %
MONOS PCT: 7 %
Monocytes Absolute: 0.9 10*3/uL (ref 0.1–1.0)
NEUTROS ABS: 7 10*3/uL (ref 1.7–7.7)
Neutrophils Relative %: 57 %

## 2016-11-23 LAB — CBC
HEMATOCRIT: 35.5 % — AB (ref 36.0–46.0)
Hemoglobin: 10.7 g/dL — ABNORMAL LOW (ref 12.0–15.0)
MCH: 21.9 pg — AB (ref 26.0–34.0)
MCHC: 30.1 g/dL (ref 30.0–36.0)
MCV: 72.6 fL — AB (ref 78.0–100.0)
PLATELETS: 422 10*3/uL — AB (ref 150–400)
RBC: 4.89 MIL/uL (ref 3.87–5.11)
RDW: 19.9 % — ABNORMAL HIGH (ref 11.5–15.5)
WBC: 13.6 10*3/uL — ABNORMAL HIGH (ref 4.0–10.5)

## 2016-11-23 LAB — URINALYSIS, ROUTINE W REFLEX MICROSCOPIC
Bilirubin Urine: NEGATIVE
GLUCOSE, UA: NEGATIVE mg/dL
Ketones, ur: 5 mg/dL — AB
Nitrite: NEGATIVE
Protein, ur: NEGATIVE mg/dL
SPECIFIC GRAVITY, URINE: 1.005 (ref 1.005–1.030)
pH: 7 (ref 5.0–8.0)

## 2016-11-23 LAB — BASIC METABOLIC PANEL
Anion gap: 12 (ref 5–15)
BUN: 11 mg/dL (ref 6–20)
CHLORIDE: 102 mmol/L (ref 101–111)
CO2: 26 mmol/L (ref 22–32)
CREATININE: 0.61 mg/dL (ref 0.44–1.00)
Calcium: 9.4 mg/dL (ref 8.9–10.3)
GFR calc non Af Amer: >60 mL/min (ref >60)
Glucose, Bld: 102 mg/dL — ABNORMAL HIGH (ref 65–99)
Potassium: 3.9 mmol/L (ref 3.5–5.1)
Sodium: 140 mmol/L (ref 135–145)

## 2016-11-23 LAB — CBG MONITORING, ED: Glucose-Capillary: 88 mg/dL (ref 65–99)

## 2016-11-23 LAB — TROPONIN I: Troponin I: <0.03 ng/mL (ref <0.03)

## 2016-11-23 MED ORDER — DIAZEPAM 5 MG PO TABS
5.0000 mg | ORAL_TABLET | Freq: Once | ORAL | Status: AC
  Administered 2016-11-23: 5 mg via ORAL
  Filled 2016-11-23: qty 1

## 2016-11-23 MED ORDER — CIPROFLOXACIN HCL 250 MG PO TABS
500.0000 mg | ORAL_TABLET | Freq: Two times a day (BID) | ORAL | Status: DC
  Administered 2016-11-24 – 2016-11-25 (×4): 500 mg via ORAL
  Filled 2016-11-23 (×4): qty 2

## 2016-11-23 MED ORDER — MECLIZINE HCL 12.5 MG PO TABS
25.0000 mg | ORAL_TABLET | Freq: Once | ORAL | Status: AC
  Administered 2016-11-23: 25 mg via ORAL
  Filled 2016-11-23: qty 2

## 2016-11-23 NOTE — ED Triage Notes (Addendum)
Patient c/o constant dizziness that started on Thursday. Per patient had blood transfusion on Thursday for anemia deficiency. Patient states dizziness started after transfusion. Dizziness worse with movement. Patient has had intermittent dizziness in past but never constant. Patient c/o headache. Nausea and blurred vision, no vomiting. Denies any slurred speech, facial drooping, chest pain, or weakness.

## 2016-11-23 NOTE — ED Notes (Signed)
Ambulated pt in hallway. Pt states she is still dizzy. States "it is not as bad as when she first came itn, but that it is the same as the last time we ambulated her".

## 2016-11-23 NOTE — ED Provider Notes (Signed)
[facility] DEPT Provider Note   CSN: [id number] Arrival date & time: 11/23/16  1509     History   Chief Complaint Chief Complaint  Patient presents with  . Dizziness    HPI Jocelyn Love is a 71 y.o. female.   Dizziness    Pt was seen at 1845. Per pt, c/o gradual onset and worsening of persistent "dizziness" for the past few weeks, worse over the past 3 days. Pt describes the dizziness as "everything is spinning around," worsens with movement of her head side to side to body position changes. Spinning improves when she lays still.  States her symptoms worsened 3 days ago after she had a blood transfusion for anemia. States before the blood transfusion she had N/V, which has since improved. Denies CP/palpitations, no SOB/cough, no abd pain, no further N/V, no diarrhea, no back pain, no focal motor weakness, no tingling/numbness in extremities, no visual changes, no facial droop, no slurred speech.   Past Medical History:  Diagnosis Date  . Anemia   . Chronic nausea   . Depression   . Headache     Patient Active Problem List   Diagnosis Date Noted  . Nausea with vomiting 11/11/2016  . Encounter for screening colonoscopy 11/11/2016  . Tongue sore 11/11/2016    Past Surgical History:  Procedure Laterality Date  . ABDOMINAL HYSTERECTOMY    . BLADDER SURGERY    . BREAST SURGERY     augmentation    OB History    No data available       Home Medications    Prior to Admission medications   Medication Sig Start Date End Date Taking? Authorizing Provider  cholecalciferol (VITAMIN D) 1000 units tablet Take 2,000 Units by mouth daily.    Yes [provider]  pantoprazole (PROTONIX) 40 MG tablet Take 40 mg by mouth daily.   Yes [provider]  sertraline (ZOLOFT) 50 MG tablet Take 50 mg by mouth daily.   Yes [provider]  vitamin B-12 (CYANOCOBALAMIN) 1000 MCG tablet Take 1,000 mcg by mouth daily.   Yes [provider]    polyethylene glycol-electrolytes (TRILYTE) 420 g solution Take 4,000 mLs by mouth as directed. 11/11/16   Danie Binder, MD    Family History Family History  Problem Relation Age of Onset  . Colon cancer Neg Hx     Social History Social History  Substance Use Topics  . Smoking status: Former Smoker    Types: Cigarettes  . Smokeless tobacco: Never Used  . Alcohol use No     Allergies   Codeine and Sulfa antibiotics   Review of Systems Review of Systems  Neurological: Positive for dizziness.  ROS: Statement: All systems negative except as marked or noted in the HPI; Constitutional: Negative for fever and chills. ; ; Eyes: Negative for eye pain, redness and discharge. ; ; ENMT: Negative for ear pain, hoarseness, nasal congestion, sinus pressure and sore throat. ; ; Cardiovascular: Negative for chest pain, palpitations, diaphoresis, dyspnea and peripheral edema. ; ; Respiratory: Negative for cough, wheezing and stridor. ; ; Gastrointestinal: Negative for nausea, vomiting, diarrhea, abdominal pain, blood in stool, hematemesis, jaundice and rectal bleeding. . ; ; Genitourinary: Negative for dysuria, flank pain and hematuria. ; ; Musculoskeletal: Negative for back pain and neck pain. Negative for swelling and trauma.; ; Skin: Negative for pruritus, rash, abrasions, blisters, bruising and skin lesion.; ; Neuro: +spinning sensation. Negative for headache, lightheadedness and neck stiffness. Negative for weakness,  altered level of consciousness, altered mental status, extremity weakness, paresthesias, involuntary movement, seizure and syncope.      Physical Exam Updated Vital Signs BP (!) 137/95   Pulse 63   Temp 98.2 F (36.8 C) (Oral)   Resp 12   Ht 5\' 3"  (1.6 m)   Wt 57.2 kg (126 lb)   SpO2 100%   BMI 22.32 kg/m   19:08:41 Orthostatic Vital Signs GP  Orthostatic Lying   BP- Lying: 155/83  Pulse- Lying: 63      Orthostatic Sitting  BP- Sitting: 156/89  Pulse- Sitting: 91       Orthostatic Standing at 0 minutes  BP- Standing at 0 minutes:  137/95  Pulse- Standing at 0 minutes: 78     Physical Exam 1850: Physical examination:  Nursing notes reviewed; Vital signs and O2 SAT reviewed;  Constitutional: Well developed, Well nourished, Well hydrated, In no acute distress; Head:  Normocephalic, atraumatic; Eyes: EOMI, PERRL, No scleral icterus; ENMT: Mouth and pharynx normal, Mucous membranes moist; Neck: Supple, Full range of motion, No lymphadenopathy; Cardiovascular: Regular rate and rhythm, No gallop; Respiratory: Breath sounds clear & equal bilaterally, No wheezes.  Speaking full sentences with ease, Normal respiratory effort/excursion; Chest: Nontender, Movement normal; Abdomen: Soft, Nontender, Nondistended, Normal bowel sounds; Genitourinary: No CVA tenderness; Extremities: Pulses normal, No tenderness, No edema, No calf edema or asymmetry.; Neuro: AA&Ox3, Major CN grossly intact. Speech clear.  No facial droop.  +right horizontal end gaze fatigable nystagmus. Grips equal. Strength 5/5 equal bilat UE's and LE's.  DTR 2/4 equal bilat UE's and LE's.  No gross sensory deficits.  Normal cerebellar testing bilat UE's (finger-nose) and LE's (heel-shin). .; Skin: Color normal, Warm, Dry.   ED Treatments / Results  Labs (all labs ordered are listed, but only abnormal results are displayed)   EKG  EKG Interpretation  Date/Time:  Sunday November 23 2016 15:26:06 EDT Ventricular Rate:  73 PR Interval:  136 QRS Duration: 94 QT Interval:  414 QTC Calculation: 456 R Axis:   -57 Text Interpretation:  Normal sinus rhythm Pulmonary disease pattern Left axis deviation Left anterior fascicular block Nonspecific T wave abnormality When compared with ECG of 01/20/2016 Nonspecific T wave abnormality is now Present Confirmed by [facility]  MD, Nunzio Cory [id number]) on 11/23/2016 6:57:07 PM       Radiology   Procedures Procedures (including critical care time)  Medications Ordered  in ED Medications  meclizine (ANTIVERT) tablet 25 mg (25 mg Oral Given 11/23/16 1911)     Initial Impression / Assessment and Plan / ED Course  I have reviewed the triage vital signs and the nursing notes.  Pertinent labs & imaging results that were available during my care of the patient were reviewed by me and considered in my medical decision making (see chart for details).  MDM Reviewed: previous chart, nursing note and vitals Reviewed previous: labs and ECG Interpretation: labs, ECG, x-ray and CT scan    Results for orders placed or performed during the hospital encounter of 77/82/42  Basic metabolic panel  Result Value Ref Range   Sodium 140 135 - 145 mmol/L   Potassium 3.9 3.5 - 5.1 mmol/L   Chloride 102 101 - 111 mmol/L   CO2 26 22 - 32 mmol/L   Glucose, Bld 102 (H) 65 - 99 mg/dL   BUN 11 6 - 20 mg/dL   Creatinine, Ser 0.61 0.44 - 1.00 mg/dL   Calcium 9.4 8.9 - 10.3 mg/dL   GFR calc non  Af Amer >60 >60 mL/min   GFR calc Af Amer >60 >60 mL/min   Anion gap 12 5 - 15  CBC  Result Value Ref Range   WBC 13.6 (H) 4.0 - 10.5 K/uL   RBC 4.89 3.87 - 5.11 MIL/uL   Hemoglobin 10.7 (L) 12.0 - 15.0 g/dL   HCT 35.5 (L) 36.0 - 46.0 %   MCV 72.6 (L) 78.0 - 100.0 fL   MCH 21.9 (L) 26.0 - 34.0 pg   MCHC 30.1 30.0 - 36.0 g/dL   RDW 19.9 (H) 11.5 - 15.5 %   Platelets 422 (H) 150 - 400 K/uL  Urinalysis, Routine w reflex microscopic  Result Value Ref Range   Color, Urine YELLOW YELLOW   APPearance HAZY (A) CLEAR   Specific Gravity, Urine 1.005 1.005 - 1.030   pH 7.0 5.0 - 8.0   Glucose, UA NEGATIVE NEGATIVE mg/dL   Hgb urine dipstick SMALL (A) NEGATIVE   Bilirubin Urine NEGATIVE NEGATIVE   Ketones, ur 5 (A) NEGATIVE mg/dL   Protein, ur NEGATIVE NEGATIVE mg/dL   Nitrite NEGATIVE NEGATIVE   Leukocytes, UA LARGE (A) NEGATIVE   RBC / HPF 0-5 0 - 5 RBC/hpf   WBC, UA TOO NUMEROUS TO COUNT 0 - 5 WBC/hpf   Bacteria, UA RARE (A) NONE SEEN   Squamous Epithelial / LPF 6-30 (A) NONE  SEEN   Mucous PRESENT   Troponin I  Result Value Ref Range   Troponin I <0.03 <0.03 ng/mL  Differential  Result Value Ref Range   Neutrophils Relative % 57 %   Neutro Abs 7.0 1.7 - 7.7 K/uL   Lymphocytes Relative 34 %   Lymphs Abs 4.1 (H) 0.7 - 4.0 K/uL   Monocytes Relative 7 %   Monocytes Absolute 0.9 0.1 - 1.0 K/uL   Eosinophils Relative 2 %   Eosinophils Absolute 0.2 0.0 - 0.7 K/uL   Basophils Relative 0 %   Basophils Absolute 0.0 0.0 - 0.1 K/uL  CBG monitoring, ED  Result Value Ref Range   Glucose-Capillary 88 65 - 99 mg/dL   Ct Head Wo Contrast Result Date: 11/23/2016 CLINICAL DATA:  Dizziness. EXAM: CT HEAD WITHOUT CONTRAST TECHNIQUE: Contiguous axial images were obtained from the base of the skull through the vertex without intravenous contrast. COMPARISON:  None. FINDINGS: Brain: No subdural, epidural, or subarachnoid hemorrhage identified. Cerebellum, brainstem, and basal cisterns are normal. Ventricles and sulci are unremarkable. No acute cortical ischemia or infarct. No mass, mass effect, or midline shift. Vascular: No hyperdense vessel or unexpected calcification. Skull: Normal. Negative for fracture or focal lesion. Sinuses/Orbits: No acute finding. Other: None. IMPRESSION: No cause for dizziness identified. No acute intracranial abnormality. Electronically Signed   By: Dorise Bullion III M.D   On: 11/23/2016 19:44    2100:  Pt given antivert then ambulated: continues to c/o dizziness. Pt not orthostatic on VS. No clear UTI on Udip (appears contaminated) and pt denies dysurai; UC pending. Will re-medicate.   2225:  Continues to c/o "dizziness" with ambulation despite anivert and valium. Will admit for MRI brain r/o posterior circulation stroke. T/C to [facility]. Marin Comment, case discussed, including:  HPI, pertinent PM/SHx, VS/PE, dx testing, ED course and treatment:  Agreeable to come to ED for evaluation.    Final Clinical Impressions(s) / ED Diagnoses   Final diagnoses:  None      New Prescriptions New Prescriptions   No medications on file      Francine Graven, DO 11/26/16 1557

## 2016-11-23 NOTE — ED Notes (Signed)
Pt stated she has not ate all day and some food might help her dizziness

## 2016-11-23 NOTE — ED Notes (Signed)
Pt was ambulated by nurse tech. Per nursing tech, pt tolerated well. Pt stated she did not have any problems with ambulation and thought her dizziness was from lack of food.

## 2016-11-23 NOTE — ED Notes (Signed)
Ambulated in hall, steady gate, stated some dizziness when getting out of bed

## 2016-11-24 DIAGNOSIS — D508 Other iron deficiency anemias: Secondary | ICD-10-CM

## 2016-11-24 DIAGNOSIS — D509 Iron deficiency anemia, unspecified: Secondary | ICD-10-CM

## 2016-11-24 DIAGNOSIS — R42 Dizziness and giddiness: Secondary | ICD-10-CM

## 2016-11-24 DIAGNOSIS — H8110 Benign paroxysmal vertigo, unspecified ear: Secondary | ICD-10-CM

## 2016-11-24 DIAGNOSIS — N390 Urinary tract infection, site not specified: Secondary | ICD-10-CM

## 2016-11-24 DIAGNOSIS — D649 Anemia, unspecified: Secondary | ICD-10-CM | POA: Diagnosis not present

## 2016-11-24 DIAGNOSIS — I951 Orthostatic hypotension: Secondary | ICD-10-CM

## 2016-11-24 LAB — CBC
HCT: 32.1 % — ABNORMAL LOW (ref 36.0–46.0)
Hemoglobin: 9.8 g/dL — ABNORMAL LOW (ref 12.0–15.0)
MCH: 22.1 pg — ABNORMAL LOW (ref 26.0–34.0)
MCHC: 30.5 g/dL (ref 30.0–36.0)
MCV: 72.3 fL — AB (ref 78.0–100.0)
PLATELETS: 344 10*3/uL (ref 150–400)
RBC: 4.44 MIL/uL (ref 3.87–5.11)
RDW: 20.2 % — AB (ref 11.5–15.5)
WBC: 9.4 10*3/uL (ref 4.0–10.5)

## 2016-11-24 LAB — BASIC METABOLIC PANEL
Anion gap: 9 (ref 5–15)
BUN: 13 mg/dL (ref 6–20)
CO2: 26 mmol/L (ref 22–32)
CREATININE: 0.6 mg/dL (ref 0.44–1.00)
Calcium: 9 mg/dL (ref 8.9–10.3)
Chloride: 102 mmol/L (ref 101–111)
GFR calc Af Amer: >60 mL/min (ref >60)
Glucose, Bld: 104 mg/dL — ABNORMAL HIGH (ref 65–99)
Potassium: 3.8 mmol/L (ref 3.5–5.1)
SODIUM: 137 mmol/L (ref 135–145)

## 2016-11-24 LAB — IRON AND TIBC
Iron: 23 ug/dL — ABNORMAL LOW (ref 28–170)
SATURATION RATIOS: 5 % — AB (ref 10.4–31.8)
TIBC: 484 ug/dL — AB (ref 250–450)
UIBC: 461 ug/dL

## 2016-11-24 LAB — FERRITIN: Ferritin: 8 ng/mL — ABNORMAL LOW (ref 11–307)

## 2016-11-24 MED ORDER — ONDANSETRON HCL 4 MG/2ML IJ SOLN: 4.0000 mg | Freq: Four times a day (QID) | INTRAMUSCULAR | Status: DC | PRN

## 2016-11-24 MED ORDER — SERTRALINE HCL 50 MG PO TABS
50.0000 mg | ORAL_TABLET | Freq: Every day | ORAL | Status: DC
  Administered 2016-11-24 – 2016-11-25 (×2): 50 mg via ORAL
  Filled 2016-11-24 (×2): qty 1

## 2016-11-24 MED ORDER — LORAZEPAM 2 MG/ML IJ SOLN
1.0000 mg | INTRAMUSCULAR | Status: DC | PRN
  Administered 2016-11-24: 1 mg via INTRAVENOUS
  Filled 2016-11-24: qty 1

## 2016-11-24 MED ORDER — VITAMIN B-12 1000 MCG PO TABS
1000.0000 ug | ORAL_TABLET | Freq: Every day | ORAL | Status: DC
  Administered 2016-11-24 – 2016-11-25 (×2): 1000 ug via ORAL
  Filled 2016-11-24 (×2): qty 1

## 2016-11-24 MED ORDER — CYCLOBENZAPRINE HCL 10 MG PO TABS: 5.0000 mg | ORAL_TABLET | Freq: Three times a day (TID) | ORAL | Status: DC

## 2016-11-24 MED ORDER — POTASSIUM CHLORIDE IN NACL 20-0.9 MEQ/L-% IV SOLN
INTRAVENOUS | Status: AC
  Administered 2016-11-24 – 2016-11-25 (×2): via INTRAVENOUS

## 2016-11-24 MED ORDER — PANTOPRAZOLE SODIUM 40 MG PO TBEC
40.0000 mg | DELAYED_RELEASE_TABLET | Freq: Every day | ORAL | Status: DC
  Administered 2016-11-24 – 2016-11-25 (×2): 40 mg via ORAL
  Filled 2016-11-24 (×2): qty 1

## 2016-11-24 MED ORDER — ACETAMINOPHEN 325 MG PO TABS
650.0000 mg | ORAL_TABLET | Freq: Four times a day (QID) | ORAL | Status: DC | PRN
  Administered 2016-11-24 – 2016-11-25 (×2): 650 mg via ORAL
  Filled 2016-11-24: qty 2

## 2016-11-24 MED ORDER — ONDANSETRON HCL 4 MG PO TABS
4.0000 mg | ORAL_TABLET | Freq: Four times a day (QID) | ORAL | Status: DC | PRN
Start: 2016-11-24 — End: 2016-11-25

## 2016-11-24 MED ORDER — SODIUM CHLORIDE 0.9 % IV SOLN
510.0000 mg | Freq: Once | INTRAVENOUS | Status: AC
  Administered 2016-11-24: 510 mg via INTRAVENOUS
  Filled 2016-11-24: qty 17

## 2016-11-24 MED ORDER — METHOCARBAMOL 500 MG PO TABS
500.0000 mg | ORAL_TABLET | Freq: Four times a day (QID) | ORAL | Status: DC
  Administered 2016-11-24 – 2016-11-25 (×4): 500 mg via ORAL
  Filled 2016-11-24 (×4): qty 1

## 2016-11-24 NOTE — Discharge Summary (Signed)
Physician Discharge Summary  Jocelyn Love ZMO:[id number] DOB: [birth year] DOA: 11/23/2016  PCP: Sharilyn Sites, MD  Admit date: 11/23/2016 Discharge date: 11/25/2016  Admitted From: Home Disposition:  Home   Recommendations for Outpatient Follow-up:  1. Follow up with PCP in 1-2 weeks 2. Please obtain BMP/CBC in one week  Discharge Condition: Stable CODE STATUS: FULL Diet recommendation: Heart Healthy / Carb Modified   Brief/Interim Summary: 71 year old female with a history of anemia and depression presented with 1 month history of dizziness and nausea which has worsened over the past 4-5 days. The patient denied any fevers, chills, chest discomfort, short of breath, vomiting, abdominal pain, dysuria, hematuria patient has had chronic loose stools over the past month without any hematochezia or melena. She denied any focal extremity weakness, visual disturbance, dysarthria.  Notably, the patient states that she has been taking 4-5 BC daily for chronic headache. However, the patient states that she stopped taking them approximately 2 weeks ago with improvement of her nausea as well as her headaches. However, the patient has endorsed poor oral intake as a result for nausea without any emesis or hematemesis.  Apparently, Dix-Hallpike maneuver was positive in the ED.  Discharge Diagnoses:  Vertigo -Multifactorial including BPPV, dehydration, UTI -Clinically improving -Continued IV fluids during the hospitalization -significantly improved after 24 to 48 hours  Orthostatic hypotension -Systolic blood pressure dropped 18 points from lying to standing -continued IVF  UTI -home with empiric cefuroxime x 4 more days to complete 5 days of therapy  Iron Deficiency anemia -Iron saturation 5%, ferritin 8 -Feraheme x 1 given -Patient received 1 unit PRBC 11/20/2016 -start po iron bid after endoscopy -EGD and colonoscopy planned 11/26/2016 as outpt  Depression -continue  zoloft  Chronic daily headache -outpatient referral to [facility] neurology -avoid NSAIDS -trial of robaxin for cervical strain   Discharge Instructions  Discharge Instructions    Ambulatory referral to Neurology    Complete by:  As directed    Refer to [facility] Neurology--chronic daily headache   Diet - low sodium heart healthy    Complete by:  As directed    Increase activity slowly    Complete by:  As directed      Allergies as of 11/25/2016      Reactions   Codeine    Nausea/vomiting   Sulfa Antibiotics    rash      Medication List    TAKE these medications   cefUROXime 500 MG tablet Commonly known as:  CEFTIN Take 1 tablet (500 mg total) by mouth 2 (two) times daily.   cholecalciferol 1000 units tablet Commonly known as:  VITAMIN D Take 2,000 Units by mouth daily.   ferrous sulfate 325 (65 FE) MG tablet Take 1 tablet (325 mg total) by mouth 2 (two) times daily with a meal.   methocarbamol 500 MG tablet Commonly known as:  ROBAXIN Take 1 tablet (500 mg total) by mouth 4 (four) times daily.   pantoprazole 40 MG tablet Commonly known as:  PROTONIX Take 40 mg by mouth daily.   polyethylene glycol-electrolytes 420 g solution Commonly known as:  TRILYTE Take 4,000 mLs by mouth as directed.   sertraline 50 MG tablet Commonly known as:  ZOLOFT Take 50 mg by mouth daily.   vitamin B-12 1000 MCG tablet Commonly known as:  CYANOCOBALAMIN Take 1,000 mcg by mouth daily.       Allergies  Allergen Reactions  . Codeine     Nausea/vomiting  . Sulfa Antibiotics  rash    Consultations:  none   Procedures/Studies: Ct Head Wo Contrast  Result Date: 11/23/2016 CLINICAL DATA:  Dizziness. EXAM: CT HEAD WITHOUT CONTRAST TECHNIQUE: Contiguous axial images were obtained from the base of the skull through the vertex without intravenous contrast. COMPARISON:  None. FINDINGS: Brain: No subdural, epidural, or subarachnoid hemorrhage identified. Cerebellum,  brainstem, and basal cisterns are normal. Ventricles and sulci are unremarkable. No acute cortical ischemia or infarct. No mass, mass effect, or midline shift. Vascular: No hyperdense vessel or unexpected calcification. Skull: Normal. Negative for fracture or focal lesion. Sinuses/Orbits: No acute finding. Other: None. IMPRESSION: No cause for dizziness identified. No acute intracranial abnormality. Electronically Signed   By: Dorise Bullion III M.D   On: 11/23/2016 19:44        Discharge Exam: Vitals:   11/24/16 2111 11/25/16 0535  BP: 114/60 129/60  Pulse: 67 69  Resp:  18  Temp:  98.3 F (36.8 C)   Vitals:   11/24/16 1414 11/24/16 2100 11/24/16 2111 11/25/16 0535  BP: 138/78 (!) 127/109 114/60 129/60  Pulse: 75 75 67 69  Resp: 16 18  18   Temp: 98.6 F (37 C) 98.3 F (36.8 C)  98.3 F (36.8 C)  TempSrc: Oral Oral  Oral  SpO2: 96% 95%  95%  Weight:      Height:        General: Pt is alert, awake, not in acute distress Cardiovascular: RRR, S1/S2 +, no rubs, no gallops Respiratory: CTA bilaterally, no wheezing, no rhonchi Abdominal: Soft, NT, ND, bowel sounds + Extremities: no edema, no cyanosis   The results of significant diagnostics from this hospitalization (including imaging, microbiology, ancillary and laboratory) are listed below for reference.    Significant Diagnostic Studies: Ct Head Wo Contrast  Result Date: 11/23/2016 CLINICAL DATA:  Dizziness. EXAM: CT HEAD WITHOUT CONTRAST TECHNIQUE: Contiguous axial images were obtained from the base of the skull through the vertex without intravenous contrast. COMPARISON:  None. FINDINGS: Brain: No subdural, epidural, or subarachnoid hemorrhage identified. Cerebellum, brainstem, and basal cisterns are normal. Ventricles and sulci are unremarkable. No acute cortical ischemia or infarct. No mass, mass effect, or midline shift. Vascular: No hyperdense vessel or unexpected calcification. Skull: Normal. Negative for fracture or  focal lesion. Sinuses/Orbits: No acute finding. Other: None. IMPRESSION: No cause for dizziness identified. No acute intracranial abnormality. Electronically Signed   By: Dorise Bullion III M.D   On: 11/23/2016 19:44     Microbiology: No results found for this or any previous visit (from the past 240 hour(s)).   Labs: Basic Metabolic Panel:  Recent Labs Lab 11/23/16 1557 11/24/16 0810 11/25/16 0439  NA 140 137 139  K 3.9 3.8 4.0  CL 102 102 107  CO2 26 26 24   GLUCOSE 102* 104* 111*  BUN 11 13 16   CREATININE 0.61 0.60 0.70  CALCIUM 9.4 9.0 8.8*   Liver Function Tests: No results for input(s): AST, ALT, ALKPHOS, BILITOT, PROT, ALBUMIN in the last 168 hours. No results for input(s): LIPASE, AMYLASE in the last 168 hours. No results for input(s): AMMONIA in the last 168 hours. CBC:  Recent Labs Lab 11/19/16 1205 11/23/16 1557 11/23/16 1943 11/24/16 0810 11/25/16 0439  WBC  --  13.6*  --  9.4 9.5  NEUTROABS  --   --  7.0  --   --   HGB 9.1* 10.7*  --  9.8* 9.5*  HCT 31.0* 35.5*  --  32.1* 31.3*  MCV  --  72.6*  --  72.3* 73.3*  PLT  --  422*  --  344 329   Cardiac Enzymes:  Recent Labs Lab 11/23/16 1943  TROPONINI <0.03   BNP: Invalid input(s): POCBNP CBG:  Recent Labs Lab 11/23/16 1531  GLUCAP 88    Time coordinating discharge:  Greater than 30 minutes  Signed:  Chavonne Sforza, DO [facility] Pager: [phone] 11/25/2016, 10:09 AM

## 2016-11-24 NOTE — Care Management Note (Signed)
Case Management Note  Patient Details  Name: Jocelyn Love MRN: [id number] Date of Birth: 04/21/1946  Subjective/Objective:                  Admitted with vertigo and orthostatic hypotension. Pt is from home, ind with ADL's. She has PCP, transportation and insurance with drug coverage. She plans on returning home at DC. She communicates and expects to needs.   Action/Plan: DC home with self care. CM will follow to DC.   Expected Discharge Date:       11/25/2016           Expected Discharge Plan:  Home/Self Care  In-House Referral:  NA  Discharge planning Services  CM Consult  Post Acute Care Choice:  NA Choice offered to:  NA  Status of Service:  Completed, signed off  Sherald Barge, RN 11/24/2016, 1:22 PM

## 2016-11-24 NOTE — Progress Notes (Signed)
PROGRESS NOTE  Jocelyn Love AQT:[id number] DOB: 11-18-1945 DOA: 11/23/2016 PCP: Sharilyn Sites, MD  Brief History:  71 year old female with a history of anemia and depression presented with 1 month history of dizziness and nausea which has worsened over the past 4-5 days. The patient denied any fevers, chills, chest discomfort, short of breath, vomiting, abdominal pain, dysuria, hematuria patient has had chronic loose stools over the past month without any hematochezia or melena. She denied any focal extremity weakness, visual disturbance, dysarthria.  Notably, the patient states that she has been taking 4-5 BC daily for chronic headache. However, the patient states that she stopped taking them approximately 2 weeks ago with improvement of her nausea as well as her headaches. However, the patient has endorsed poor oral intake as a result for nausea without any emesis or hematemesis.  Apparently, Dix-Hallpike maneuver was positive in the ED.  Assessment/Plan: Vertigo -Multifactorial including BPPV, dehydration, UTI -Clinically improving -Continue IV fluids -Repeat orthostatics -still dizzy but improving  Orthostatic hypotension -Systolic blood pressure dropped 18 points  -continue IVF  UTI -continue cipro pending culture data  Iron Deficiency anemia -Iron saturation 3%, ferritin 3 -Patient received 1 unit PRBC 11/20/2016 -repeat iron studies -EGD and colonoscopy planned 11/27/2016 as outpt  Depression -continue zoloft    Disposition Plan:   Home 6/26 if stable  Family Communication:   Son updated at bedside--Total time spent 31 minutes.  Greater than 50% spent face to face counseling and coordinating care.   0845 to [phone]   Consultants:  none  Code Status:  FULL   DVT Prophylaxis:  SCDs   Procedures: As Listed in Progress Note Above  Antibiotics: None    Subjective: Patient denies fevers, chills, headache, chest pain, dyspnea, nausea, vomiting,  diarrhea, abdominal pain, dysuria, hematuria, hematochezia, and melena.   Objective: Vitals:   11/23/16 2330 11/23/16 2345 11/24/16 0030 11/24/16 0521  BP: (!) 149/78  (!) 161/76 132/73  Pulse:   61 84  Resp: 11 14  14   Temp:   98.7 F (37.1 C) 98.8 F (37.1 C)  TempSrc:   Oral Oral  SpO2:   96% 96%  Weight:   57.6 kg (126 lb 15.8 oz)   Height:   5\' 3"  (1.6 m)    No intake or output data in the 24 hours ending 11/24/16 0910 Weight change:  Exam:   General:  Pt is alert, follows commands appropriately, not in acute distress  HEENT: No icterus, No thrush, No neck mass, [place]/AT  Cardiovascular: RRR, S1/S2, no rubs, no gallops  Respiratory: CTA bilaterally, no wheezing, no crackles, no rhonchi  Abdomen: Soft/+BS, non tender, non distended, no guarding  Extremities: No edema, No lymphangitis, No petechiae, No rashes, no synovitis   Data Reviewed: I have personally reviewed following labs and imaging studies Basic Metabolic Panel:  Recent Labs Lab 11/23/16 1557 11/24/16 0810  NA 140 137  K 3.9 3.8  CL 102 102  CO2 26 26  GLUCOSE 102* 104*  BUN 11 13  CREATININE 0.61 0.60  CALCIUM 9.4 9.0   Liver Function Tests: No results for input(s): AST, ALT, ALKPHOS, BILITOT, PROT, ALBUMIN in the last 168 hours. No results for input(s): LIPASE, AMYLASE in the last 168 hours. No results for input(s): AMMONIA in the last 168 hours. Coagulation Profile: No results for input(s): INR, PROTIME in the last 168 hours. CBC:  Recent Labs Lab 11/19/16 1205 11/23/16 1557 11/23/16 1943 11/24/16  0810  WBC  --  13.6*  --  9.4  NEUTROABS  --   --  7.0  --   HGB 9.1* 10.7*  --  9.8*  HCT 31.0* 35.5*  --  32.1*  MCV  --  72.6*  --  72.3*  PLT  --  422*  --  344   Cardiac Enzymes:  Recent Labs Lab 11/23/16 1943  TROPONINI <0.03   BNP: Invalid input(s): POCBNP CBG:  Recent Labs Lab 11/23/16 1531  GLUCAP 88   HbA1C: No results for input(s): HGBA1C in the last 72  hours. Urine analysis:    Component Value Date/Time   COLORURINE YELLOW 11/23/2016 1537   APPEARANCEUR HAZY (A) 11/23/2016 1537   LABSPEC 1.005 11/23/2016 1537   PHURINE 7.0 11/23/2016 1537   GLUCOSEU NEGATIVE 11/23/2016 1537   HGBUR SMALL (A) 11/23/2016 1537   BILIRUBINUR NEGATIVE 11/23/2016 1537   KETONESUR 5 (A) 11/23/2016 1537   PROTEINUR NEGATIVE 11/23/2016 1537   NITRITE NEGATIVE 11/23/2016 1537   LEUKOCYTESUR LARGE (A) 11/23/2016 1537   Sepsis Labs: @LABRCNTIP (procalcitonin:4,lacticidven:4) )No results found for this or any previous visit (from the past 240 hour(s)).   Scheduled Meds: . ciprofloxacin  500 mg Oral BID  . pantoprazole  40 mg Oral Daily  . sertraline  50 mg Oral Daily  . vitamin B-12  1,000 mcg Oral Daily   Continuous Infusions:  Procedures/Studies: Ct Head Wo Contrast  Result Date: 11/23/2016 CLINICAL DATA:  Dizziness. EXAM: CT HEAD WITHOUT CONTRAST TECHNIQUE: Contiguous axial images were obtained from the base of the skull through the vertex without intravenous contrast. COMPARISON:  None. FINDINGS: Brain: No subdural, epidural, or subarachnoid hemorrhage identified. Cerebellum, brainstem, and basal cisterns are normal. Ventricles and sulci are unremarkable. No acute cortical ischemia or infarct. No mass, mass effect, or midline shift. Vascular: No hyperdense vessel or unexpected calcification. Skull: Normal. Negative for fracture or focal lesion. Sinuses/Orbits: No acute finding. Other: None. IMPRESSION: No cause for dizziness identified. No acute intracranial abnormality. Electronically Signed   By: Dorise Bullion III M.D   On: 11/23/2016 19:44    Zakariah Urwin, DO  [facility] Pager [phone]  If 7PM-7AM, please contact night-coverage [email or web address] Password [email or web address] 11/24/2016, 9:10 AM   LOS: 0 days

## 2016-11-24 NOTE — Care Management Obs Status (Signed)
[facility] NOTIFICATION   Patient Details  Name: Jocelyn Love MRN: [id number] Date of Birth: 04-01-1946   Medicare Observation Status Notification Given:  Yes    Sherald Barge, RN 11/24/2016, 1:22 PM

## 2016-11-24 NOTE — H&P (Signed)
History and Physical    Jocelyn Love QQV:[id number] DOB: 05/03/1946 DOA: 11/23/2016  PCP: Sharilyn Sites, MD  Patient coming from: Home.    Chief Complaint:  Vertigo with head movement, nausea, no vomiting.   HPI: Jocelyn Love is an 71 y.o. female with hx of anemia, recent transfusion, hx of depression, HA, presented to the ER with severe vertigo with head movements.  She also has nausea.  She has no HA or focal weakness or clumsiness.  Work up in the ER included a negative head CT.  WBC of 13.6K, and normal renal Fx tests.  She was given oral Valium, and antivert, and since she is feeling no better, hospitalist was asked to admit her for MRI to r/out posterior circulation.     ED Course:  See above.  Rewiew of Systems:  Constitutional: Negative for malaise, fever and chills. No significant weight loss or weight gain Eyes: Negative for eye pain, redness and discharge, diplopia, visual changes, or flashes of light. ENMT: Negative for ear pain, hoarseness, nasal congestion, sinus pressure and sore throat. No headaches; tinnitus, drooling, or problem swallowing. Cardiovascular: Negative for chest pain, palpitations, diaphoresis, dyspnea and peripheral edema. ; No orthopnea, PND Respiratory: Negative for cough, hemoptysis, wheezing and stridor. No pleuritic chestpain. Gastrointestinal: Negative for diarrhea, constipation,  melena, blood in stool, hematemesis, jaundice and rectal bleeding.    Genitourinary: Negative for frequency, dysuria, incontinence,flank pain and hematuria; Musculoskeletal: Negative for back pain and neck pain. Negative for swelling and trauma.;  Skin: . Negative for pruritus, rash, abrasions, bruising and skin lesion.; ulcerations Neuro: Negative for headache, lightheadedness and neck stiffness. Negative for weakness, altered level of consciousness , altered mental status, extremity weakness, burning feet, involuntary movement, seizure and syncope.  Psych: negative  for anxiety, depression, insomnia, tearfulness, panic attacks, hallucinations, paranoia, suicidal or homicidal ideation    Past Medical History:  Diagnosis Date  . Anemia   . Chronic nausea   . Depression   . Headache     Past Surgical History:  Procedure Laterality Date  . ABDOMINAL HYSTERECTOMY    . BLADDER SURGERY    . BREAST SURGERY     augmentation     reports that she has quit smoking. Her smoking use included Cigarettes. She has never used smokeless tobacco. She reports that she does not drink alcohol or use drugs.  Allergies  Allergen Reactions  . Codeine     Nausea/vomiting  . Sulfa Antibiotics     rash    Family History  Problem Relation Age of Onset  . Colon cancer Neg Hx      Prior to Admission medications   Medication Sig Start Date End Date Taking? Authorizing Provider  cholecalciferol (VITAMIN D) 1000 units tablet Take 2,000 Units by mouth daily.    Yes [provider]  pantoprazole (PROTONIX) 40 MG tablet Take 40 mg by mouth daily.   Yes [provider]  sertraline (ZOLOFT) 50 MG tablet Take 50 mg by mouth daily.   Yes [provider]  vitamin B-12 (CYANOCOBALAMIN) 1000 MCG tablet Take 1,000 mcg by mouth daily.   Yes [provider]  polyethylene glycol-electrolytes (TRILYTE) 420 g solution Take 4,000 mLs by mouth as directed. 11/11/16   Danie Binder, MD    Physical Exam: Vitals:   11/23/16 2300 11/23/16 2330 11/23/16 2345 11/24/16 0030  BP: (!) 163/92 (!) 149/78  (!) 161/76  Pulse:    61  Resp: 19 11 14    Temp:  98.7 F (37.1 C)  TempSrc:    Oral  SpO2:    96%  Weight:    57.6 kg (126 lb 15.8 oz)  Height:    5\' 3"  (1.6 m)      Constitutional: NAD, calm, comfortable Vitals:   11/23/16 2300 11/23/16 2330 11/23/16 2345 11/24/16 0030  BP: (!) 163/92 (!) 149/78  (!) 161/76  Pulse:    61  Resp: 19 11 14    Temp:    98.7 F (37.1 C)  TempSrc:    Oral  SpO2:    96%  Weight:    57.6 kg (126 lb 15.8  oz)  Height:    5\' 3"  (1.6 m)   Eyes: PERRL, lids and conjunctivae normal ENMT: Mucous membranes are moist. Posterior pharynx clear of any exudate or lesions.Normal dentition.  Neck: normal, supple, no masses, no thyromegaly Respiratory: clear to auscultation bilaterally, no wheezing, no crackles. Normal respiratory effort. No accessory muscle use.  Cardiovascular: Regular rate and rhythm, no murmurs / rubs / gallops. No extremity edema. 2+ pedal pulses. No carotid bruits.  Abdomen: no tenderness, no masses palpated. No hepatosplenomegaly. Bowel sounds positive.  Musculoskeletal: no clubbing / cyanosis. No joint deformity upper and lower extremities. Good ROM, no contractures. Normal muscle tone.  Skin: no rashes, lesions, ulcers. No induration Neurologic: CN 2-12 grossly intact. Sensation intact, DTR normal. Strength 5/5 in all 4. I performed a Barannay maneuver, and it was positive toward the right.  It fatigues.  Psychiatric: Normal judgment and insight. Alert and oriented x 3. Normal mood.    Labs on Admission: I have personally reviewed following labs and imaging studies  CBC:  Recent Labs Lab 11/19/16 1205 11/23/16 1557 11/23/16 1943  WBC  --  13.6*  --   NEUTROABS  --   --  7.0  HGB 9.1* 10.7*  --   HCT 31.0* 35.5*  --   MCV  --  72.6*  --   PLT  --  422*  --    Basic Metabolic Panel:  Recent Labs Lab 11/23/16 1557  NA 140  K 3.9  CL 102  CO2 26  GLUCOSE 102*  BUN 11  CREATININE 0.61  CALCIUM 9.4   GFR: Cardiac Enzymes:  Recent Labs Lab 11/23/16 1943  TROPONINI <0.03   CBG:  Recent Labs Lab 11/23/16 1531  GLUCAP 88   Urine analysis:    Component Value Date/Time   COLORURINE YELLOW 11/23/2016 1537   APPEARANCEUR HAZY (A) 11/23/2016 1537   LABSPEC 1.005 11/23/2016 1537   PHURINE 7.0 11/23/2016 1537   GLUCOSEU NEGATIVE 11/23/2016 1537   HGBUR SMALL (A) 11/23/2016 1537   BILIRUBINUR NEGATIVE 11/23/2016 1537   KETONESUR 5 (A) 11/23/2016 1537    PROTEINUR NEGATIVE 11/23/2016 1537   NITRITE NEGATIVE 11/23/2016 1537   LEUKOCYTESUR LARGE (A) 11/23/2016 1537   Radiological Exams on Admission: Ct Head Wo Contrast  Result Date: 11/23/2016 CLINICAL DATA:  Dizziness. EXAM: CT HEAD WITHOUT CONTRAST TECHNIQUE: Contiguous axial images were obtained from the base of the skull through the vertex without intravenous contrast. COMPARISON:  None. FINDINGS: Brain: No subdural, epidural, or subarachnoid hemorrhage identified. Cerebellum, brainstem, and basal cisterns are normal. Ventricles and sulci are unremarkable. No acute cortical ischemia or infarct. No mass, mass effect, or midline shift. Vascular: No hyperdense vessel or unexpected calcification. Skull: Normal. Negative for fracture or focal lesion. Sinuses/Orbits: No acute finding. Other: None. IMPRESSION: No cause for dizziness identified. No acute intracranial abnormality. Electronically Signed  By: Dorise Bullion III M.D   On: 11/23/2016 19:44    EKG: Independently reviewed.   Assessment/Plan Principal Problem:   BPV (benign positional vertigo) Active Problems:   Depression   Anemia   Acute lower UTI    PLAN:   BPV:  Her HallPike maneuver is positive toward the right, indicative of peripheral vertigo.  Will Tx her symptoms with Benzo both for the nausea, and for the vertigo.  No indication for MRI of the brain.  Patient and family also decline the MRI.  Reset maneuver for the stone was performed as well.    Depression:  Stable.  Continue with her home meds.   UTI:  She was found to have a UTI as well.   I have started her on oral Cipro.     DVT prophylaxis: SCD.  Low risk with early ambulation.  Code Status: FULL CODE.  Family Communication: daughter at bedside.  Disposition Plan: Home.  Consults called: None.  Admission status: OBS>    Daphine Loch MD FACP. [facility]  If 7PM-7AM, please contact night-coverage [email or web address] Password [facility]  11/24/2016, 4:27 AM

## 2016-11-25 ENCOUNTER — Telehealth: Payer: Self-pay | Admitting: General Practice

## 2016-11-25 DIAGNOSIS — R42 Dizziness and giddiness: Secondary | ICD-10-CM | POA: Diagnosis not present

## 2016-11-25 DIAGNOSIS — D508 Other iron deficiency anemias: Secondary | ICD-10-CM | POA: Diagnosis not present

## 2016-11-25 DIAGNOSIS — I951 Orthostatic hypotension: Secondary | ICD-10-CM | POA: Diagnosis not present

## 2016-11-25 DIAGNOSIS — D649 Anemia, unspecified: Secondary | ICD-10-CM | POA: Diagnosis not present

## 2016-11-25 DIAGNOSIS — H8110 Benign paroxysmal vertigo, unspecified ear: Secondary | ICD-10-CM | POA: Diagnosis not present

## 2016-11-25 LAB — BASIC METABOLIC PANEL
Anion gap: 8 (ref 5–15)
BUN: 16 mg/dL (ref 6–20)
CALCIUM: 8.8 mg/dL — AB (ref 8.9–10.3)
CHLORIDE: 107 mmol/L (ref 101–111)
CO2: 24 mmol/L (ref 22–32)
CREATININE: 0.7 mg/dL (ref 0.44–1.00)
Glucose, Bld: 111 mg/dL — ABNORMAL HIGH (ref 65–99)
Potassium: 4 mmol/L (ref 3.5–5.1)
SODIUM: 139 mmol/L (ref 135–145)

## 2016-11-25 LAB — CBC
HCT: 31.3 % — ABNORMAL LOW (ref 36.0–46.0)
HEMOGLOBIN: 9.5 g/dL — AB (ref 12.0–15.0)
MCH: 22.2 pg — AB (ref 26.0–34.0)
MCHC: 30.4 g/dL (ref 30.0–36.0)
MCV: 73.3 fL — ABNORMAL LOW (ref 78.0–100.0)
PLATELETS: 329 10*3/uL (ref 150–400)
RBC: 4.27 MIL/uL (ref 3.87–5.11)
RDW: 21 % — ABNORMAL HIGH (ref 11.5–15.5)
WBC: 9.5 10*3/uL (ref 4.0–10.5)

## 2016-11-25 LAB — URINE CULTURE

## 2016-11-25 MED ORDER — FERROUS SULFATE 325 (65 FE) MG PO TABS: 325.0000 mg | ORAL_TABLET | Freq: Two times a day (BID) | ORAL | 0 refills | Status: DC

## 2016-11-25 MED ORDER — METHOCARBAMOL 500 MG PO TABS: 500.0000 mg | ORAL_TABLET | Freq: Four times a day (QID) | ORAL | 0 refills | Status: DC

## 2016-11-25 MED ORDER — CEFUROXIME AXETIL 500 MG PO TABS: 500.0000 mg | ORAL_TABLET | Freq: Two times a day (BID) | ORAL | 0 refills | Status: AC

## 2016-11-25 NOTE — Telephone Encounter (Signed)
Per Hoyle Sauer from Endoscopy the patient will need to come in tomorrow at 3 for her procedure and the patient is aware.

## 2016-11-25 NOTE — Consult Note (Signed)
   [facility] Inpatient Consult   11/25/2016  Jocelyn Love [birth year] [id number]   Patient screened for potential [place] Management services. Patient is on the [facility] registry as a benefit of their  [employer] Electronic medical record reveals there were no identifiable [facility] care management needs. [facility] Care Management services not appropriate at this time. If patient's post hospital needs change please place a [facility] Care Management consult. For questions please contact:   Sheyli Horwitz RN, [facility] Liaison  [phone]) Business Mobile [phone]) Toll free office

## 2016-11-25 NOTE — Progress Notes (Signed)
Late Entry  Patient discharged with instructions, prescription, and care notes.  Verbalized understanding via teach back.  IV was removed and the site was WNL. Patient voiced no further complaints or concerns at the time of discharge.  Appointments scheduled per instructions.  Patient left the floor via w/c family  And staff in stable condition.   Notified Dr. Oneida Alar per patient/ family request to ask if the patient should be on a clear liquid diet versus the one that she was on due to the scheduled colonoscopy for tomorrow.  MD inially after reviewing her chart wanted to cancel the procedure due to the patient being in the hospital and reschedule.  After talking with the patient and family they stated that they really wanted to go along with the procedure.  MD states that its fine they will go on with the procedure and that she had not cancelled it yet.  Voiced to the patient and she stated that she was happy.

## 2016-11-26 ENCOUNTER — Encounter (HOSPITAL_COMMUNITY): Payer: Self-pay | Admitting: *Deleted

## 2016-11-26 ENCOUNTER — Ambulatory Visit (HOSPITAL_COMMUNITY)
Admission: RE | Admit: 2016-11-26 | Discharge: 2016-11-26 | Disposition: A | Discharge status: Home / Self Care | Payer: Medicare Other | Source: Ambulatory Visit | Attending: Gastroenterology | Admitting: Gastroenterology

## 2016-11-26 ENCOUNTER — Encounter (HOSPITAL_COMMUNITY)
Admission: RE | Disposition: A | Discharge status: Home / Self Care | Payer: Self-pay | Source: Ambulatory Visit | Attending: Gastroenterology

## 2016-11-26 DIAGNOSIS — K2970 Gastritis, unspecified, without bleeding: Secondary | ICD-10-CM

## 2016-11-26 DIAGNOSIS — Z79899 Other long term (current) drug therapy: Secondary | ICD-10-CM | POA: Insufficient documentation

## 2016-11-26 DIAGNOSIS — F329 Major depressive disorder, single episode, unspecified: Secondary | ICD-10-CM | POA: Insufficient documentation

## 2016-11-26 DIAGNOSIS — K3189 Other diseases of stomach and duodenum: Secondary | ICD-10-CM | POA: Diagnosis not present

## 2016-11-26 DIAGNOSIS — K648 Other hemorrhoids: Secondary | ICD-10-CM | POA: Diagnosis not present

## 2016-11-26 DIAGNOSIS — K146 Glossodynia: Secondary | ICD-10-CM | POA: Insufficient documentation

## 2016-11-26 DIAGNOSIS — D500 Iron deficiency anemia secondary to blood loss (chronic): Secondary | ICD-10-CM | POA: Diagnosis not present

## 2016-11-26 DIAGNOSIS — D120 Benign neoplasm of cecum: Secondary | ICD-10-CM | POA: Diagnosis not present

## 2016-11-26 DIAGNOSIS — R1013 Epigastric pain: Secondary | ICD-10-CM | POA: Diagnosis not present

## 2016-11-26 DIAGNOSIS — Z1211 Encounter for screening for malignant neoplasm of colon: Secondary | ICD-10-CM

## 2016-11-26 DIAGNOSIS — Z87891 Personal history of nicotine dependence: Secondary | ICD-10-CM | POA: Diagnosis not present

## 2016-11-26 DIAGNOSIS — R110 Nausea: Secondary | ICD-10-CM

## 2016-11-26 DIAGNOSIS — K319 Disease of stomach and duodenum, unspecified: Secondary | ICD-10-CM | POA: Diagnosis not present

## 2016-11-26 DIAGNOSIS — K635 Polyp of colon: Secondary | ICD-10-CM | POA: Diagnosis not present

## 2016-11-26 DIAGNOSIS — D509 Iron deficiency anemia, unspecified: Secondary | ICD-10-CM | POA: Diagnosis not present

## 2016-11-26 DIAGNOSIS — D125 Benign neoplasm of sigmoid colon: Secondary | ICD-10-CM

## 2016-11-26 DIAGNOSIS — K31819 Angiodysplasia of stomach and duodenum without bleeding: Secondary | ICD-10-CM | POA: Diagnosis not present

## 2016-11-26 DIAGNOSIS — K222 Esophageal obstruction: Secondary | ICD-10-CM | POA: Insufficient documentation

## 2016-11-26 HISTORY — PX: ESOPHAGOGASTRODUODENOSCOPY: SHX5428

## 2016-11-26 HISTORY — PX: COLONOSCOPY: SHX5424

## 2016-11-26 HISTORY — PX: SAVORY DILATION: SHX5439

## 2016-11-26 SURGERY — COLONOSCOPY
Anesthesia: Moderate Sedation

## 2016-11-26 MED ORDER — SODIUM CHLORIDE 0.9 % IV SOLN
INTRAVENOUS | Status: DC
  Administered 2016-11-26: 15:00:00 via INTRAVENOUS

## 2016-11-26 MED ORDER — MIDAZOLAM HCL 5 MG/5ML IJ SOLN
INTRAMUSCULAR | Status: DC | PRN
  Administered 2016-11-26: 2 mg via INTRAVENOUS
  Administered 2016-11-26: 1 mg via INTRAVENOUS
  Administered 2016-11-26: 2 mg via INTRAVENOUS
  Administered 2016-11-26: 1 mg via INTRAVENOUS

## 2016-11-26 MED ORDER — LIDOCAINE HCL 2 % EX GEL
CUTANEOUS | Status: AC
  Filled 2016-11-26: qty 30

## 2016-11-26 MED ORDER — SODIUM CHLORIDE 0.9 % IJ SOLN
INTRAMUSCULAR | Status: DC | PRN
  Administered 2016-11-26: 3 mL via INTRAVENOUS
  Administered 2016-11-26: 7 mL via INTRAVENOUS

## 2016-11-26 MED ORDER — MEPERIDINE HCL 100 MG/ML IJ SOLN
INTRAMUSCULAR | Status: AC
  Filled 2016-11-26: qty 2

## 2016-11-26 MED ORDER — MIDAZOLAM HCL 5 MG/5ML IJ SOLN
INTRAMUSCULAR | Status: AC
  Filled 2016-11-26: qty 10

## 2016-11-26 MED ORDER — LIDOCAINE VISCOUS 2 % MT SOLN: OROMUCOSAL | 1 refills | Status: DC

## 2016-11-26 MED ORDER — LIDOCAINE VISCOUS 2 % MT SOLN
OROMUCOSAL | Status: DC | PRN
  Administered 2016-11-26: 4 mL via OROMUCOSAL

## 2016-11-26 MED ORDER — LIDOCAINE VISCOUS 2 % MT SOLN
OROMUCOSAL | Status: AC
  Filled 2016-11-26: qty 15

## 2016-11-26 MED ORDER — MINERAL OIL PO OIL
TOPICAL_OIL | ORAL | Status: AC
  Filled 2016-11-26: qty 30

## 2016-11-26 MED ORDER — LIDOCAINE HCL 2 % EX GEL
CUTANEOUS | Status: DC | PRN
  Administered 2016-11-26: 1

## 2016-11-26 MED ORDER — MEPERIDINE HCL 100 MG/ML IJ SOLN
INTRAMUSCULAR | Status: DC | PRN
  Administered 2016-11-26 (×3): 25 mg via INTRAVENOUS

## 2016-11-26 MED ORDER — STERILE WATER FOR IRRIGATION IR SOLN
Status: DC | PRN
  Administered 2016-11-26: 200 mL

## 2016-11-26 NOTE — Op Note (Signed)
[facility] Patient Name: Jocelyn Love Procedure Date: 11/26/2016 5:05 PM MRN: [id number] Date of Birth: 02-04-1946 Attending MD: Barney Drain , MD CSN: [id number] Age: 71 Admit Type: Outpatient Procedure:                Colonoscopy with emr and cold forceps polypectomy Indications:              Iron deficiency anemia Providers:                Barney Drain, MD, Rosina Lowenstein, RN, Aram Candela Referring MD:             Halford Chessman MD, MD Medicines:                Meperidine 75 mg IV, Midazolam 5 mg IV Complications:            No immediate complications. Estimated Blood Loss:     Estimated blood loss was minimal. Procedure:                Pre-Anesthesia Assessment:                           - Prior to the procedure, a History and Physical                            was performed, and patient medications and                            allergies were reviewed. The patient's tolerance of                            previous anesthesia was also reviewed. The risks                            and benefits of the procedure and the sedation                            options and risks were discussed with the patient.                            All questions were answered, and informed consent                            was obtained. Prior Anticoagulants: The patient has                            taken aspirin. ASA Grade Assessment: II - A patient                            with mild systemic disease. After reviewing the                            risks and benefits, the patient was deemed in                            satisfactory condition to undergo the procedure.  After obtaining informed consent, the colonoscope                            was passed under direct vision. Throughout the                            procedure, the patient's blood pressure, pulse, and                            oxygen saturations were monitored continuously. The               EC-[id number] ([id number]) scope was introduced through                            the anus and advanced to the 10 cm into the ileum.                            The colonoscopy was somewhat difficult due to a                            tortuous colon. Successful completion of the                            procedure was aided by COLOWRAP. The patient                            tolerated the procedure well. The quality of the                            bowel preparation was excellent. The terminal                            ileum, ileocecal valve, appendiceal orifice, and                            rectum were photographed. Scope In: 5:37:39 PM Scope Out: 6:09:32 PM Scope Withdrawal Time: 0 hours 28 minutes 43 seconds  Total Procedure Duration: 0 hours 31 minutes 53 seconds  Findings:      The terminal ileum appeared normal.      A 10 mm polyp was found in the cecum. The polyp was carpet-like.       Preparations were made for mucosal resection. Saline was injected to       raise the lesion(7 CC). Snare mucosal resection was performed. PIECEMEAL       Resection and retrieval were complete WITHTEH COLD FORCEPS. Coagulation       for destruction of remaining portion of lesion using argon plasma at 0.5       liters/minute and 20 watts was successful. Estimated blood loss was       minimal.      A 4 mm polyp was found in the sigmoid colon. The polyp was sessile. The       polyp was removed with a cold biopsy forceps. Resection and retrieval       were complete.      Internal hemorrhoids were found during retroflexion. The  hemorrhoids       were moderate. Impression:               - The examined portion of the ileum was normal.                           - One 10 mm polyp in the cecum, removed with                            mucosal resection. Resected and retrieved. Treated                            with argon plasma coagulation (APC).                           - One 4 mm polyp in the  sigmoid colon, removed with                            a cold biopsy forceps. Resected and retrieved.                           - Internal hemorrhoids.                           -NO SOURCE FOR PROFOUND FEDA IDENTIFIED Moderate Sedation:      Moderate (conscious) sedation was administered by the endoscopy nurse       and supervised by the endoscopist. The following parameters were       monitored: oxygen saturation, heart rate, blood pressure, and response       to care. Total physician intraservice time was 65 minutes. Recommendation:           - Repeat colonoscopy in 3 years for surveillance.                            PROCEED TO EGD.                           - Resume previous diet.                           - Continue present medications.                           - Await pathology results.                           - Return to my office in 3 months.                           - Patient has a contact number available for                            emergencies. The signs and symptoms of potential                            delayed complications were discussed  with the                            patient. Return to normal activities tomorrow.                            Written discharge instructions were provided to the                            patient. Procedure Code(s):        --- Professional ---                           [phone], Colonoscopy, flexible; with endoscopic                            mucosal resection                           45380, 60, Colonoscopy, flexible; with biopsy,                            single or multiple                           99152, Moderate sedation services provided by the                            same physician or other qualified health care                            professional performing the diagnostic or                            therapeutic service that the sedation supports,                            requiring the presence of an independent  trained                            observer to assist in the monitoring of the                            patient's level of consciousness and physiological                            status; initial 15 minutes of intraservice time,                            patient age 48 years or older                           [phone], Moderate sedation services; each additional                            15 minutes intraservice time  62229, Moderate sedation services; each additional                            15 minutes intraservice time                           99153, Moderate sedation services; each additional                            15 minutes intraservice time Diagnosis Code(s):        --- Professional ---                           K64.8, Other hemorrhoids                           D12.0, Benign neoplasm of cecum                           D12.5, Benign neoplasm of sigmoid colon                           D50.9, Iron deficiency anemia, unspecified CPT copyright 2016 American Medical Association. All rights reserved. The codes documented in this report are preliminary and upon coder review may  be revised to meet current compliance requirements. Barney Drain, MD Barney Drain, MD 11/26/2016 6:35:51 PM This report has been signed electronically. Number of Addenda: 0

## 2016-11-26 NOTE — Op Note (Signed)
[facility] Patient Name: Jocelyn Love Procedure Date: 11/26/2016 6:11 PM MRN: [id number] Date of Birth: August 01, 1945 Attending MD: Barney Drain , MD CSN: [id number] Age: 71 Admit Type: Outpatient Procedure:                Upper GI endoscopy WITH COLD FORCEPS BIOPSY Indications:              Iron deficiency anemia secondary to chronic blood                            loss, Dyspepsia-C/O TONGUE PAIN Providers:                Barney Drain, MD, Rosina Lowenstein, RN, Aram Candela Referring MD:             Halford Chessman MD, MD Medicines:                Midazolam 1 mg IV Complications:            No immediate complications. Estimated Blood Loss:     Estimated blood loss was minimal. Procedure:                Pre-Anesthesia Assessment:                           - Prior to the procedure, a History and Physical                            was performed, and patient medications and                            allergies were reviewed. The patient's tolerance of                            previous anesthesia was also reviewed. The risks                            and benefits of the procedure and the sedation                            options and risks were discussed with the patient.                            All questions were answered, and informed consent                            was obtained. Prior Anticoagulants: The patient has                            taken aspirin. ASA Grade Assessment: II - A patient                            with mild systemic disease. After reviewing the                            risks and benefits, the patient was deemed in  satisfactory condition to undergo the procedure.                            After obtaining informed consent, the endoscope was                            passed under direct vision. Throughout the                            procedure, the patient's blood pressure, pulse, and                            oxygen  saturations were monitored continuously. The                            EG-[id number] ([id number]) scope was introduced through the                            mouth, and advanced to the second part of duodenum.                            The upper GI endoscopy was accomplished without                            difficulty. The patient tolerated the procedure                            well. Scope In: 6:15:00 PM Scope Out: 6:23:53 PM Total Procedure Duration: 0 hours 8 minutes 53 seconds  Findings:      A non-obstructing Schatzki ring (acquired) was found at the       gastroesophageal junction.      Patchy mild inflammation characterized by congestion (edema) and       erythema was found in the gastric antrum. Biopsies were taken with a       cold forceps for Helicobacter pylori testing.      A single small no bleeding angioectasia was found in the gastric antrum.      A single small angioectasia without bleeding was found in the second       portion of the duodenum.      The exam of the duodenum was otherwise normal. Impression:               - Non-obstructing Schatzki ring.                           - MILD Gastritis.                           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 The following parameters were       monitored: oxygen saturation, heart rate, blood pressure, and response       to care. Total physician intraservice time was 65 minutes. Recommendation:           -  Await pathology results. IF NO ATROPHIC GASTRICTI                            OR CELIAC SPRUE, PT NEED GIVENS STUDY.                           - Resume previous diet. AVOID ASA/NSAIDS                            INDEFINITELY.                           - Continue present medications.                           - Patient has a contact number available for                             emergencies. The signs and symptoms of potential                            delayed complications were discussed with the                            patient. Return to normal activities tomorrow.                            Written discharge instructions were provided to the                            patient. Procedure Code(s):        --- Professional ---                           [phone], Esophagogastroduodenoscopy, flexible,                            transoral; with biopsy, single or multiple                           99152, Moderate sedation services provided by the                            same physician or other qualified health care                            professional performing the diagnostic or                            therapeutic service that the sedation supports,                            requiring the presence of an independent trained                            observer to assist in the  monitoring of the                            patient's level of consciousness and physiological                            status; initial 15 minutes of intraservice time,                            patient age 35 years or older                           [id number], Moderate sedation services; each additional                            15 minutes intraservice time                           99153, Moderate sedation services; each additional                            15 minutes intraservice time                           99153, Moderate sedation services; each additional                            15 minutes intraservice time Diagnosis Code(s):        --- Professional ---                           K22.2, Esophageal obstruction                           K29.70, Gastritis, unspecified, without bleeding                           K31.819, Angiodysplasia of stomach and duodenum                            without bleeding                           D50.0, Iron deficiency anemia secondary to blood                             loss (chronic)                           R10.13, Epigastric pain CPT copyright 2016 [employer]. All rights reserved. The codes documented in this report are preliminary and upon coder review may  be revised to meet current compliance requirements. Barney Drain, MD Barney Drain, MD 11/26/2016 6:45:43 PM This report has been signed electronically. Number of Addenda: 0

## 2016-11-26 NOTE — Discharge Instructions (Signed)
You have internal hemorrhoids. YOU HAD 2 polyps removed. Your upper endoscopy shows  Arteriovenous Malformation(AVMs), A SUPERFICIAL COLLECTION OF BLOOD VESSELS ON THE SURFACE OF THE stomach and SMALL BOWEL THAT CAUSE YOU TO LOSE BLOOD BUT YOU DO NOT SEE ANYTHING COME OUT.  An AVM may occur in the STOMACH, COLON, OR SMALL BOWEL.  You have mild gastritis and a ring at the base of your esophagus.I biopsied your stomach and small bowel.    USE VISCOUS LIDOCAINE 1-2 TSP EVERY 4 HOURS IF NEEDED FOR TONGUE OR THROAT PAIN. NO MORE THAN 8 DOSES/DAY.  STRICTLY AVOID ASPIRIN, BC/GODDY POWDERS, IBUPROFEN/MOTRIN, OR NAPROXEN/ALEVE.  AVOID ITEMS THAT TRIGGER GASTRITIS.  FOLLOW A HIGH FIBER/LOW FAT DIET. AVOID ITEMS THAT CAUSE BLOATING. MEATS SHOULD BE BAKED, BROILED, OR BOILED. Avoid fried foods. SEE INFO BELOW.  CONTINUE PROTONIX. TAKE 30 MINUTES PRIOR TO BREAKFAST.  FOLLOW UP IN 3 MOS.   Next colonoscopy in 3 years.   ENDOSCOPY Care After Read the instructions outlined below and refer to this sheet in the next week. These discharge instructions provide you with general information on caring for yourself after you leave the hospital. While your treatment has been planned according to the most current medical practices available, unavoidable complications occasionally occur. If you have any problems or questions after discharge, call DR. Aviona Martenson, [phone].  ACTIVITY  You may resume your regular activity, but move at a slower pace for the next 24 hours.   Take frequent rest periods for the next 24 hours.   Walking will help get rid of the air and reduce the bloated feeling in your belly (abdomen).   No driving for 24 hours (because of the medicine (anesthesia) used during the test).   You may shower.   Do not sign any important legal documents or operate any machinery for 24 hours (because of the anesthesia used during the test).    NUTRITION  Drink plenty of fluids.   You may resume  your normal diet as instructed by your doctor.   Begin with a light meal and progress to your normal diet. Heavy or fried foods are harder to digest and may make you feel sick to your stomach (nauseated).   Avoid alcoholic beverages for 24 hours or as instructed.    MEDICATIONS  You may resume your normal medications.   WHAT YOU CAN EXPECT TODAY  Some feelings of bloating in the abdomen.   Passage of more gas than usual.   Spotting of blood in your stool or on the toilet paper  .  IF YOU HAD POLYPS REMOVED DURING THE ENDOSCOPY:  Eat a soft diet IF YOU HAVE NAUSEA, BLOATING, ABDOMINAL PAIN, OR VOMITING.    FINDING OUT THE RESULTS OF YOUR TEST Not all test results are available during your visit. DR. Oneida Alar WILL CALL YOU WITHIN 14 DAYS OF YOUR PROCEDUE WITH YOUR RESULTS. Do not assume everything is normal if you have not heard from DR. Atif Chapple, CALL HER OFFICE AT [phone].  SEEK IMMEDIATE MEDICAL ATTENTION AND CALL THE OFFICE: [phone] IF:  You have more than a spotting of blood in your stool.   Your belly is swollen (abdominal distention).   You are nauseated or vomiting.   You have a temperature over 101F.   You have abdominal pain or discomfort that is severe or gets worse throughout the day.  Polyps, Colon  A polyp is extra tissue that grows inside your body. Colon polyps grow in the large intestine. The large intestine,  also called the colon, is part of your digestive system. It is a long, hollow tube at the end of your digestive tract where your body makes and stores stool. Most polyps are not dangerous. They are benign. This means they are not cancerous. But over time, some types of polyps can turn into cancer. Polyps that are smaller than a pea are usually not harmful. But larger polyps could someday become or may already be cancerous. To be safe, doctors remove all polyps and test them.   PREVENTION There is not one sure way to prevent polyps. You might be  able to lower your risk of getting them if you:  Eat more fruits and vegetables and less fatty food.   Do not smoke.   Avoid alcohol.   Exercise every day.   Lose weight if you are overweight.   Eating more calcium and folate can also lower your risk of getting polyps. Some foods that are rich in calcium are milk, cheese, and broccoli. Some foods that are rich in folate are chickpeas, kidney beans, and spinach.    Gastritis  Gastritis is an inflammation (the body's way of reacting to injury and/or infection) of the stomach. It is often caused by viral or bacterial (germ) infections. It can also be caused BY ASPIRIN, BC/GOODY POWDER'S, (IBUPROFEN) MOTRIN, OR ALEVE (NAPROXEN), chemicals (including alcohol), SPICY FOODS, and medications. This illness may be associated with generalized malaise (feeling tired, not well), UPPER ABDOMINAL STOMACH cramps, and fever. One common bacterial cause of gastritis is an organism known as H. Pylori. This can be treated with antibiotics.    High-Fiber Diet A high-fiber diet changes your normal diet to include more whole grains, legumes, fruits, and vegetables. Changes in the diet involve replacing refined carbohydrates with unrefined foods. The calorie level of the diet is essentially unchanged. The Dietary Reference Intake (recommended amount) for adult males is 38 grams per day. For adult females, it is 25 grams per day. Pregnant and lactating women should consume 28 grams of fiber per day. Fiber is the intact part of a plant that is not broken down during digestion. Functional fiber is fiber that has been isolated from the plant to provide a beneficial effect in the body. PURPOSE  Increase stool bulk.   Ease and regulate bowel movements.   Lower cholesterol.  INDICATIONS THAT YOU NEED MORE FIBER  Constipation and hemorrhoids.   Uncomplicated diverticulosis (intestine condition) and irritable bowel syndrome.   Weight management.   As a  protective measure against hardening of the arteries (atherosclerosis), diabetes, and cancer.   GUIDELINES FOR INCREASING FIBER IN THE DIET  Start adding fiber to the diet slowly. A gradual increase of about 5 more grams (2 slices of whole-wheat bread, 2 servings of most fruits or vegetables, or 1 bowl of high-fiber cereal) per day is best. Too rapid an increase in fiber may result in constipation, flatulence, and bloating.   Drink enough water and fluids to keep your urine clear or pale yellow. Water, juice, or caffeine-free drinks are recommended. Not drinking enough fluid may cause constipation.   Eat a variety of high-fiber foods rather than one type of fiber.   Try to increase your intake of fiber through using high-fiber foods rather than fiber pills or supplements that contain small amounts of fiber.   The goal is to change the types of food eaten. Do not supplement your present diet with high-fiber foods, but replace foods in your present diet.   INCLUDE  A VARIETY OF FIBER SOURCES  Replace refined and processed grains with whole grains, canned fruits with fresh fruits, and incorporate other fiber sources. White rice, white breads, and most bakery goods contain little or no fiber.   Brown whole-grain rice, buckwheat oats, and many fruits and vegetables are all good sources of fiber. These include: broccoli, Brussels sprouts, cabbage, cauliflower, beets, sweet potatoes, white potatoes (skin on), carrots, tomatoes, eggplant, squash, berries, fresh fruits, and dried fruits.   Cereals appear to be the richest source of fiber. Cereal fiber is found in whole grains and bran. Bran is the fiber-rich outer coat of cereal grain, which is largely removed in refining. In whole-grain cereals, the bran remains. In breakfast cereals, the largest amount of fiber is found in those with "bran" in their names. The fiber content is sometimes indicated on the label.   You may need to include additional  fruits and vegetables each day.   In baking, for 1 cup white flour, you may use the following substitutions:   1 cup whole-wheat flour minus 2 tablespoons.   1/2 cup white flour plus 1/2 cup whole-wheat flour.   Hemorrhoids Hemorrhoids are dilated (enlarged) veins around the rectum. Sometimes clots will form in the veins. This makes them swollen and painful. These are called thrombosed hemorrhoids. Causes of hemorrhoids include:  Constipation.   Straining to have a bowel movement.   HEAVY LIFTING  HOME CARE INSTRUCTIONS  Eat a well balanced diet and drink 6 to 8 glasses of water every day to avoid constipation. You may also use a bulk laxative.   Avoid straining to have bowel movements.   Keep anal area dry and clean.   Do not use a donut shaped pillow or sit on the toilet for long periods. This increases blood pooling and pain.   Move your bowels when your body has the urge; this will require less straining and will decrease pain and pressure.

## 2016-11-26 NOTE — H&P (Addendum)
  Primary Care Physician:  Sharilyn Sites, MD Primary Gastroenterologist:  Dr. Oneida Alar  Pre-Procedure History & Physical: HPI:  Jocelyn Love is a 71 y.o. female here for DYSPHAGIA before but not now/FEDA. TONGUE PAIN.  Past Medical History:  Diagnosis Date  . Anemia   . Chronic nausea   . Depression   . Headache    Past Surgical History:  Procedure Laterality Date  . ABDOMINAL HYSTERECTOMY    . BLADDER SURGERY    . BREAST SURGERY     augmentation    Prior to Admission medications   Medication Sig Start Date End Date Taking? Authorizing Provider  cefUROXime (CEFTIN) 500 MG tablet Take 1 tablet (500 mg total) by mouth 2 (two) times daily. 11/25/16 12/05/16 Yes Tat, Shanon Brow, MD  cholecalciferol (VITAMIN D) 1000 units tablet Take 2,000 Units by mouth daily.    Yes [provider]  methocarbamol (ROBAXIN) 500 MG tablet Take 1 tablet (500 mg total) by mouth 4 (four) times daily. 11/25/16  Yes Tat, Shanon Brow, MD  pantoprazole (PROTONIX) 40 MG tablet Take 40 mg by mouth daily.   Yes [provider]  polyethylene glycol-electrolytes (TRILYTE) 420 g solution Take 4,000 mLs by mouth as directed. 11/11/16  Yes Venicia Vandall L, MD  sertraline (ZOLOFT) 50 MG tablet Take 50 mg by mouth daily.   Yes [provider]  ferrous sulfate 325 (65 FE) MG tablet Take 1 tablet (325 mg total) by mouth 2 (two) times daily with a meal. 11/25/16   Tat, Shanon Brow, MD  vitamin B-12 (CYANOCOBALAMIN) 1000 MCG tablet Take 1,000 mcg by mouth daily.    [provider]    Allergies as of 11/11/2016 - Review Complete 11/11/2016  Allergen Reaction Noted  . Codeine  01/20/2016  . Sulfa antibiotics  01/20/2016    Family History  Problem Relation Age of Onset  . Colon cancer Neg Hx     Social History   Social History  . Marital status: Widowed    Spouse name: N/A  . Number of children: N/A  . Years of education: N/A   Occupational History  . Not on file.   Social History Main  Topics  . Smoking status: Former Smoker    Types: Cigarettes  . Smokeless tobacco: Never Used  . Alcohol use No  . Drug use: No  . Sexual activity: Not on file   Other Topics Concern  . Not on file   Social History Narrative  . No narrative on file    Review of Systems: See HPI, otherwise negative ROS   Physical Exam: BP 130/81   Pulse 90   Temp 98.8 F (37.1 C) (Oral)   Resp 15   Ht 5\' 3"  (1.6 m)   Wt 126 lb (57.2 kg)   SpO2 97%   BMI 22.32 kg/m  General:   Alert,  pleasant and cooperative in NAD Head:  Normocephalic and atraumatic. Neck:  Supple; Lungs:  Clear throughout to auscultation.    Heart:  Regular rate and rhythm. Abdomen:  Soft, nontender and nondistended. Normal bowel sounds, without guarding, and without rebound.   Neurologic:  Alert and  oriented x4;  grossly normal neurologically.  Impression/Plan:     DYSpepsia/FEDA  PLAN:  EGD/TCS TODAY. DISCUSSED PROCEDURE, BENEFITS, & RISKS: < 1% chance of medication reaction, bleeding, perforation, or rupture of spleen/liver.

## 2016-11-30 DIAGNOSIS — A0472 Enterocolitis due to Clostridium difficile, not specified as recurrent: Secondary | ICD-10-CM

## 2016-11-30 HISTORY — DX: Enterocolitis due to Clostridium difficile, not specified as recurrent: A04.72

## 2016-12-01 ENCOUNTER — Encounter (HOSPITAL_COMMUNITY): Payer: Self-pay | Admitting: Gastroenterology

## 2016-12-01 ENCOUNTER — Ambulatory Visit (INDEPENDENT_AMBULATORY_CARE_PROVIDER_SITE_OTHER): Payer: Medicare Other | Admitting: Otolaryngology

## 2016-12-01 DIAGNOSIS — K122 Cellulitis and abscess of mouth: Secondary | ICD-10-CM | POA: Diagnosis not present

## 2016-12-07 ENCOUNTER — Telehealth: Payer: Self-pay | Admitting: Gastroenterology

## 2016-12-07 NOTE — Telephone Encounter (Signed)
Please call pt. She had ONE simple adenoma AND ONE HYPERPLASTIC POLYP removed. FOLLOW A High fiber diet.     SHE NEEDS A EGD WITH GIVENS CAPSULE PLACEMENT ON JUL24.  USE VISCOUS LIDOCAINE 1-2 TSP EVERY 4 HOURS IF NEEDED FOR TONGUE OR THROAT PAIN. NO MORE THAN 8 DOSES/DAY.  STRICTLY AVOID ASPIRIN, BC/GODDY POWDERS, IBUPROFEN/MOTRIN, OR NAPROXEN/ALEVE.  AVOID ITEMS THAT TRIGGER GASTRITIS.  FOLLOW A HIGH FIBER/LOW FAT DIET. AVOID ITEMS THAT CAUSE BLOATING. MEATS SHOULD BE BAKED, BROILED, OR BOILED. Avoid fried foods.   CONTINUE PROTONIX. TAKE 30 MINUTES PRIOR TO BREAKFAST.  FOLLOW UP IN 3 MOS E30 FEDA/AVMs.   Next colonoscopy in 3 years.

## 2016-12-08 ENCOUNTER — Encounter: Payer: Self-pay | Admitting: Gastroenterology

## 2016-12-08 ENCOUNTER — Other Ambulatory Visit: Payer: Self-pay

## 2016-12-08 DIAGNOSIS — D509 Iron deficiency anemia, unspecified: Secondary | ICD-10-CM

## 2016-12-08 NOTE — Telephone Encounter (Signed)
ON RECALL AND APPT MADE ° °

## 2016-12-08 NOTE — Telephone Encounter (Signed)
Pt's daughter Lottie Rater) called office and was informed. EGD with Givens Capsule will be done 12/23/16 at 2:30pm, pt to arrive at 1:30pm. Will mail instructions. Daughter said the pt is taking Iron. Advised her that pt should stop taking Iron for 7 days before procedure (to stop Iron starting 12/16/16).

## 2016-12-08 NOTE — Telephone Encounter (Signed)
Tried to call pt, no answer, LMOAM for her to call office. 

## 2016-12-08 NOTE — Telephone Encounter (Signed)
Submitted PA info for EGD/Givens Capsule placement via [facility] website. No PA needed. Decision ID# [id number].

## 2016-12-12 NOTE — Progress Notes (Signed)
Reviewed EGD and colonoscopy findings. Plan for EGD with capsule placement in the upcoming future. Would be a good idea to go ahead and recheck CBC next week.

## 2016-12-15 ENCOUNTER — Other Ambulatory Visit: Payer: Self-pay

## 2016-12-15 DIAGNOSIS — D509 Iron deficiency anemia, unspecified: Secondary | ICD-10-CM

## 2016-12-15 NOTE — Progress Notes (Signed)
Pt is aware. Orders have been entered and released and she will go to lab mid week.

## 2016-12-18 ENCOUNTER — Other Ambulatory Visit: Payer: Self-pay

## 2016-12-18 DIAGNOSIS — D509 Iron deficiency anemia, unspecified: Secondary | ICD-10-CM | POA: Diagnosis not present

## 2016-12-19 LAB — CBC WITH DIFFERENTIAL/PLATELET
Basophils Absolute: 0 10*3/uL (ref 0.0–0.2)
Basos: 0 %
EOS (ABSOLUTE): 0.2 10*3/uL (ref 0.0–0.4)
EOS: 2 %
HEMOGLOBIN: 12.1 g/dL (ref 11.1–15.9)
Hematocrit: 38.9 % (ref 34.0–46.6)
Immature Grans (Abs): 0 10*3/uL (ref 0.0–0.1)
Immature Granulocytes: 0 %
LYMPHS ABS: 3.1 10*3/uL (ref 0.7–3.1)
Lymphs: 27 %
MCH: 24.7 pg — AB (ref 26.6–33.0)
MCHC: 31.1 g/dL — ABNORMAL LOW (ref 31.5–35.7)
MCV: 79 fL (ref 79–97)
MONOCYTES: 6 %
Monocytes Absolute: 0.6 10*3/uL (ref 0.1–0.9)
NEUTROS ABS: 7.7 10*3/uL — AB (ref 1.4–7.0)
Neutrophils: 65 %
Platelets: 381 10*3/uL — ABNORMAL HIGH (ref 150–379)
RBC: 4.9 x10E6/uL (ref 3.77–5.28)
WBC: 11.6 10*3/uL — ABNORMAL HIGH (ref 3.4–10.8)

## 2016-12-22 ENCOUNTER — Telehealth: Payer: Self-pay

## 2016-12-22 ENCOUNTER — Ambulatory Visit (INDEPENDENT_AMBULATORY_CARE_PROVIDER_SITE_OTHER): Payer: Medicare Other | Admitting: Otolaryngology

## 2016-12-22 ENCOUNTER — Other Ambulatory Visit: Payer: Self-pay

## 2016-12-22 DIAGNOSIS — D509 Iron deficiency anemia, unspecified: Secondary | ICD-10-CM

## 2016-12-22 DIAGNOSIS — D3702 Neoplasm of uncertain behavior of tongue: Secondary | ICD-10-CM

## 2016-12-22 DIAGNOSIS — K148 Other diseases of tongue: Secondary | ICD-10-CM | POA: Diagnosis not present

## 2016-12-22 NOTE — Progress Notes (Signed)
Hemoglobin now normal. Would advise her to continue iron for now to replenish iron stores. She should proceed with capsule study as planned.  Recommend CBC, ferritin in 2 months.

## 2016-12-22 NOTE — Progress Notes (Signed)
Pt is aware and lab orders on file for 02/23/2017.

## 2016-12-22 NOTE — Telephone Encounter (Signed)
Called pt. EGD/Givens capsule placement for 12/23/16 moved to 10:00am. She will arrive at 9:00am. LMOVM and informed Endo scheduler.

## 2016-12-23 ENCOUNTER — Encounter (HOSPITAL_COMMUNITY): Payer: Self-pay

## 2016-12-23 ENCOUNTER — Encounter (HOSPITAL_COMMUNITY): Payer: Self-pay | Admitting: Anesthesiology

## 2016-12-23 ENCOUNTER — Encounter (HOSPITAL_COMMUNITY)
Admission: RE | Disposition: A | Discharge status: Home / Self Care | Payer: Self-pay | Source: Ambulatory Visit | Attending: Gastroenterology

## 2016-12-23 ENCOUNTER — Encounter: Payer: Self-pay | Admitting: Gastroenterology

## 2016-12-23 ENCOUNTER — Ambulatory Visit (HOSPITAL_COMMUNITY)
Admission: RE | Admit: 2016-12-23 | Discharge: 2016-12-23 | Disposition: A | Discharge status: Home / Self Care | Payer: Medicare Other | Source: Ambulatory Visit | Attending: Gastroenterology | Admitting: Gastroenterology

## 2016-12-23 ENCOUNTER — Telehealth: Payer: Self-pay | Admitting: Gastroenterology

## 2016-12-23 DIAGNOSIS — K3189 Other diseases of stomach and duodenum: Secondary | ICD-10-CM | POA: Insufficient documentation

## 2016-12-23 DIAGNOSIS — Z87891 Personal history of nicotine dependence: Secondary | ICD-10-CM | POA: Insufficient documentation

## 2016-12-23 DIAGNOSIS — K222 Esophageal obstruction: Secondary | ICD-10-CM | POA: Diagnosis not present

## 2016-12-23 DIAGNOSIS — R197 Diarrhea, unspecified: Secondary | ICD-10-CM | POA: Insufficient documentation

## 2016-12-23 DIAGNOSIS — F329 Major depressive disorder, single episode, unspecified: Secondary | ICD-10-CM | POA: Insufficient documentation

## 2016-12-23 DIAGNOSIS — D500 Iron deficiency anemia secondary to blood loss (chronic): Secondary | ICD-10-CM | POA: Insufficient documentation

## 2016-12-23 DIAGNOSIS — K31819 Angiodysplasia of stomach and duodenum without bleeding: Secondary | ICD-10-CM

## 2016-12-23 DIAGNOSIS — Z79899 Other long term (current) drug therapy: Secondary | ICD-10-CM | POA: Diagnosis not present

## 2016-12-23 DIAGNOSIS — K2970 Gastritis, unspecified, without bleeding: Secondary | ICD-10-CM | POA: Diagnosis not present

## 2016-12-23 DIAGNOSIS — D509 Iron deficiency anemia, unspecified: Secondary | ICD-10-CM

## 2016-12-23 DIAGNOSIS — Z800 Family history of malignant neoplasm of digestive organs: Secondary | ICD-10-CM | POA: Insufficient documentation

## 2016-12-23 HISTORY — PX: GIVENS CAPSULE STUDY: SHX5432

## 2016-12-23 HISTORY — PX: ESOPHAGOGASTRODUODENOSCOPY: SHX5428

## 2016-12-23 SURGERY — EGD (ESOPHAGOGASTRODUODENOSCOPY)
Anesthesia: Moderate Sedation

## 2016-12-23 MED ORDER — SODIUM CHLORIDE 0.9 % IV SOLN
INTRAVENOUS | Status: DC
  Administered 2016-12-23: 10:00:00 via INTRAVENOUS

## 2016-12-23 MED ORDER — MEPERIDINE HCL 100 MG/ML IJ SOLN
INTRAMUSCULAR | Status: DC | PRN
  Administered 2016-12-23: 50 mg via INTRAVENOUS
  Administered 2016-12-23: 25 mg via INTRAVENOUS

## 2016-12-23 MED ORDER — STERILE WATER FOR IRRIGATION IR SOLN
Status: DC | PRN
  Administered 2016-12-23: 12:00:00

## 2016-12-23 MED ORDER — MEPERIDINE HCL 100 MG/ML IJ SOLN
INTRAMUSCULAR | Status: AC
  Filled 2016-12-23: qty 2

## 2016-12-23 MED ORDER — MIDAZOLAM HCL 5 MG/5ML IJ SOLN
INTRAMUSCULAR | Status: DC | PRN
  Administered 2016-12-23 (×2): 1 mg via INTRAVENOUS
  Administered 2016-12-23: 2 mg via INTRAVENOUS

## 2016-12-23 MED ORDER — LIDOCAINE VISCOUS 2 % MT SOLN
OROMUCOSAL | Status: AC
  Filled 2016-12-23: qty 15

## 2016-12-23 MED ORDER — LIDOCAINE VISCOUS 2 % MT SOLN
OROMUCOSAL | Status: DC | PRN
  Administered 2016-12-23: 6 mL via OROMUCOSAL

## 2016-12-23 MED ORDER — MIDAZOLAM HCL 5 MG/5ML IJ SOLN
INTRAMUSCULAR | Status: AC
  Filled 2016-12-23: qty 10

## 2016-12-23 NOTE — Discharge Instructions (Signed)
I PLACED THE CAPSULE IN YOUR SMALL BOWEL.  IT WILL PASS IN YOUR STOOL. You have mild gastritis & A RING AT THE BASE OF YOUR ESOPHAGUS.   GO TO OFFICE TO PICK UP STOOL SAMPLE CUPS. COMPLETE STOOL STUDIES.  AVOID DIARY. SEE INFO BELOW.  HOLD IRON. RE-START JUL 26.  Marland KitchenUSE PEPTO BISMOL 4 PILLS BID FOR 2 MOS. IT MAY CAUSE BLACK STOOLS.   YOUR GIVENS RESULTS SHOULD BE BACK WITHIN THE NEXT 7 DAYS.  FOLLOW UP IN OCT 2018.    ENDOSCOPY Care After Read the instructions outlined below and refer to this sheet in the next week. These discharge instructions provide you with general information on caring for yourself after you leave the hospital. While your treatment has been planned according to the most current medical practices available, unavoidable complications occasionally occur. If you have any problems or questions after discharge, call DR. Aubriella Perezgarcia, [phone].  ACTIVITY  You may resume your regular activity, but move at a slower pace for the next 24 hours.   Take frequent rest periods for the next 24 hours.   Walking will help get rid of the air and reduce the bloated feeling in your belly (abdomen).   No driving for 24 hours (because of the medicine (anesthesia) used during the test).   You may shower.   Do not sign any important legal documents or operate any machinery for 24 hours (because of the anesthesia used during the test).    NUTRITION  Drink plenty of fluids.   You may resume your normal diet as instructed by your doctor.   Begin with a light meal and progress to your normal diet. Heavy or fried foods are harder to digest and may make you feel sick to your stomach (nauseated).   Avoid alcoholic beverages for 24 hours or as instructed.    MEDICATIONS  You may resume your normal medications.   WHAT YOU CAN EXPECT TODAY  Some feelings of bloating in the abdomen.   Passage of more gas than usual.   Spotting of blood in your stool or on the toilet  paper  .  IF YOU HAD POLYPS REMOVED DURING THE ENDOSCOPY:  Eat a soft diet IF YOU HAVE NAUSEA, BLOATING, ABDOMINAL PAIN, OR VOMITING.    FINDING OUT THE RESULTS OF YOUR TEST Not all test results are available during your visit. DR. Oneida Alar WILL CALL YOU WITHIN 7 DAYS OF YOUR PROCEDUE WITH YOUR RESULTS. Do not assume everything is normal if you have not heard from DR. Lonisha Bobby IN ONE WEEK, CALL HER OFFICE AT [phone].  SEEK IMMEDIATE MEDICAL ATTENTION AND CALL THE OFFICE: [phone] IF:  You have more than a spotting of blood in your stool.   Your belly is swollen (abdominal distention).   You are nauseated or vomiting.   You have a temperature over 101F.   You have abdominal pain or discomfort that is severe or gets worse throughout the day.    Lactose Free Diet Lactose is a carbohydrate that is found mainly in milk and milk products, as well as in foods with added milk or whey. Lactose must be digested by the enzyme in order to be used by the body. Lactose intolerance occurs when there is a shortage of lactase. When your body is not able to digest lactose, you may feel sick to your stomach (nausea), bloating, cramping, gas and diarrhea.  There are many dairy products that may be tolerated better than milk by some people:  The  use of cultured dairy products such as yogurt, buttermilk, cottage cheese, and sweet acidophilus milk (Kefir) for lactase-deficient individuals is usually well tolerated. This is because the healthy bacteria help digest lactose.   Lactose-hydrolyzed milk (Lactaid) contains 40-90% less lactose than milk and may also be well tolerated.   SPECIAL NOTES  Lactose is a carbohydrates. The major food source is dairy products. Reading food labels is important. Many products contain lactose even when they are not made from milk. Look for the following words: whey, milk solids, dry milk solids, nonfat dry milk powder. Typical sources of lactose other than dairy  products include breads, candies, cold cuts, prepared and processed foods, and commercial sauces and gravies.   All foods must be prepared without milk, cream, or other dairy foods.   Soy milk and lactose-free supplements (LACTASE) may be used as an alternative to milk.   FOOD GROUP ALLOWED/RECOMMENDED AVOID/USE SPARINGLY  BREADS / STARCHES 4 servings or more* Breads and rolls made without milk. [place], [place], or [place] bread. Breads and rolls that contain milk. Prepared mixes such as muffins, biscuits, waffles, pancakes. Sweet rolls, donuts, [place] toast (if made with milk or lactose).  Crackers: Soda crackers, graham crackers. Any crackers prepared without lactose. Zwieback crackers, corn curls, or any that contain lactose.  Cereals: Cooked or dry cereals prepared without lactose (read labels). Cooked or dry cereals prepared with lactose (read labels). Total, Cocoa Krispies. Special K.  Potatoes / Pasta / Rice: Any prepared without milk or lactose. Popcorn. Instant potatoes, frozen [place] fries, scalloped or au gratin potatoes.  VEGETABLES 2 servings or more Fresh, frozen, and canned vegetables. Creamed or breaded vegetables. Vegetables in a cheese sauce or with lactose-containing margarines.  FRUIT 2 servings or more All fresh, canned, or frozen fruits that are not processed with lactose. Any canned or frozen fruits processed with lactose.  MEAT & SUBSTITUTES 2 servings or more (4 to 6 oz. total per day) Plain beef, chicken, fish, [place], lamb, veal, pork, or ham. Kosher prepared meat products. Strained or junior meats that do not contain milk. Eggs, soy meat substitutes, nuts. Scrambled eggs, omelets, and souffles that contain milk. Creamed or breaded meat, fish, or fowl. Sausage products such as wieners, liver sausage, or cold cuts that contain milk solids. Cheese, cottage cheese, or cheese spreads.  MILK None. (See BEVERAGES for milk substitutes. See DESSERTS for ice cream and  frozen desserts.) Milk (whole, 2%, skim, or chocolate). Evaporated, powdered, or condensed milk; malted milk.  SOUPS & COMBINATION FOODS Bouillon, broth, vegetable soups, clear soups, consomms. Homemade soups made with allowed ingredients. Combination or prepared foods that do not contain milk or milk products (read labels). Cream soups, chowders, commercially prepared soups containing lactose. Macaroni and cheese, pizza. Combination or prepared foods that contain milk or milk products.  DESSERTS & SWEETS In moderation Water and fruit ices; gelatin; angel food cake. Homemade cookies, pies, or cakes made from allowed ingredients. Pudding (if made with water or a milk substitute). Lactose-free tofu desserts. Sugar, honey, corn syrup, jam, jelly; marmalade; molasses (beet sugar); Pure sugar candy; marshmallows. Ice cream, ice milk, sherbet, custard, pudding, frozen yogurt. Commercial cake and cookie mixes. Desserts that contain chocolate. Pie crust made with milk-containing margarine; reduced-calorie desserts made with a sugar substitute that contains lactose. Toffee, peppermint, butterscotch, chocolate, caramels.  FATS & OILS In moderation Butter (as tolerated; contains very small amounts of lactose). Margarines and dressings that do not contain milk, Vegetable oils, shortening, Miracle Whip, mayonnaise, nondairy cream &  whipped toppings without lactose or milk solids added (examples: Coffee Rich, Carnation Coffeemate, Rich's Whipped Topping, PolyRich). Berniece Salines. Margarines and salad dressings containing milk; cream, cream cheese; peanut butter with added milk solids, sour cream, chip dips, made with sour cream.  BEVERAGES Carbonated drinks; tea; coffee and freeze-dried coffee; some instant coffees (check labels). Fruit drinks; fruit and vegetable juice; Rice or Soy milk. Ovaltine, hot chocolate. Some cocoas; some instant coffees; instant iced teas; powdered fruit drinks (read labels).   CONDIMENTS  / MISCELLANEOUS Soy sauce, carob powder, olives, gravy made with water, baker's cocoa, pickles, pure seasonings and spices, wine, pure monosodium glutamate, catsup, mustard. Some chewing gums, chocolate, some cocoas. Certain antibiotics and vitamin / mineral preparations. Spice blends if they contain milk products. MSG extender. Artificial sweeteners that contain lactose such as Equal (Nutra-Sweet) and Sweet 'n Low. Some nondairy creamers (read labels).   SAMPLE MENU*  Breakfast   Orange Juice.  Banana.   Bran flakes.   Nondairy Creamer.  [place] Bread (toasted).   Butter or milk-free margarine.   Coffee or tea.    Noon Meal   Chicken Breast.  Rice.   Green beans.   Butter or milk-free margarine.  Fresh melon.   Coffee or tea.    Evening Meal   Roast Beef.  Baked potato.   Butter or milk-free margarine.   Broccoli.   Lettuce salad with vinegar and oil dressing.  [employer]   Coffee or tea.

## 2016-12-23 NOTE — Op Note (Signed)
[facility] Patient Name: Jocelyn Love Procedure Date: 12/23/2016 11:20 AM MRN: [id number] Date of Birth: 04-23-1946 Attending MD: Barney Drain , MD CSN: [id number] Age: 71 Admit Type: Outpatient Procedure:                Upper GI endoscopy WITH GIVENS CAPSULE PLACEMENT Indications:              Iron deficiency anemia secondary to chronic blood                            loss Providers:                Barney Drain, MD, Hinton Rao, RN, Randa Spike, Technician Referring MD:             Halford Chessman MD, MD Medicines:                Meperidine 75 mg IV, Midazolam 4 mg IV Complications:            No immediate complications. Estimated Blood Loss:     Estimated blood loss: none. Procedure:                Pre-Anesthesia Assessment:                           - Prior to the procedure, a History and Physical                            was performed, and patient medications and                            allergies were reviewed. The patient's tolerance of                            previous anesthesia was also reviewed. The risks                            and benefits of the procedure and the sedation                            options and risks were discussed with the patient.                            All questions were answered, and informed consent                            was obtained. Prior Anticoagulants: The patient has                            taken no previous anticoagulant or antiplatelet                            agents. ASA Grade Assessment: II - A patient with  mild systemic disease. After reviewing the risks                            and benefits, the patient was deemed in                            satisfactory condition to undergo the procedure.                            After obtaining informed consent, the endoscope was                            passed under direct vision. Throughout the                   procedure, the patient's blood pressure, pulse, and                            oxygen saturations were monitored continuously. The                            EG-[id number] ([id number]) scope was introduced through the                            mouth, and advanced to the second part of duodenum.                            The upper GI endoscopy was accomplished without                            difficulty. The patient tolerated the procedure                            well. Scope In: 11:58:52 AM Scope Out: 12:04:46 PM Total Procedure Duration: 0 hours 5 minutes 54 seconds  Findings:      A moderate Schatzki ring (acquired) was found at the gastroesophageal       junction.      Patchy mild inflammation characterized by congestion (edema) and       erythema was found in the gastric antrum.      A moderate post-ulcer deformity was found in the duodenal bulb. CAPSULE       RELEASED DISTAL TO DUODENAL WEB. Impression:               - Moderate Schatzki ring.                           - MILD Gastritis.                           - Duodenal deformity DUE TO DUODENAL WEB 2o TO                            NSAID USE. Moderate Sedation:      Moderate (conscious) sedation was administered by the endoscopy nurse       and supervised by the endoscopist. The following parameters  were       monitored: oxygen saturation, heart rate, blood pressure, and response       to care. Total physician intraservice time was 17 minutes. Recommendation:           - NPO & THE N MAYHAVE CLEARS IN 4 HRS.                           - Continue present medications. RETRUNGIVENS                            CAPSULE JUL 25.                           - Return to my office in 3 months.                           - Patient has a contact number available for                            emergencies. The signs and symptoms of potential                            delayed complications were discussed with the                             patient. Return to normal activities tomorrow.                            Written discharge instructions were provided to the                            patient. Procedure Code(s):        --- Professional ---                           [phone], Esophagogastroduodenoscopy, flexible,                            transoral; diagnostic, including collection of                            specimen(s) by brushing or washing, when performed                            (separate procedure)                           99152, Moderate sedation services provided by the                            same physician or other qualified health care                            professional performing the diagnostic or  therapeutic service that the sedation supports,                            requiring the presence of an independent trained                            observer to assist in the monitoring of the                            patient's level of consciousness and physiological                            status; initial 15 minutes of intraservice time,                            patient age 39 years or older Diagnosis Code(s):        --- Professional ---                           K22.2, Esophageal obstruction                           K29.70, Gastritis, unspecified, without bleeding                           K31.89, Other diseases of stomach and duodenum                           D50.0, Iron deficiency anemia secondary to blood                            loss (chronic) CPT copyright 2016 American Medical Association. All rights reserved. The codes documented in this report are preliminary and upon coder review may  be revised to meet current compliance requirements. Barney Drain, MD Barney Drain, MD 12/23/2016 12:16:18 PM This report has been signed electronically. Number of Addenda: 0

## 2016-12-23 NOTE — Telephone Encounter (Signed)
PT C/O FREQUENT WATERY/LOOSE STOOLS EVERY TIME SHE GOES TO URINATE.  SHE NEEDS TO COLLECT STOOL FOR C DIFF, FECAL ELASTASE, GIARDIA AG, AND FECAL LACTOFERRIN

## 2016-12-23 NOTE — H&P (View-Only) (Signed)
  Primary Care Physician:  Sharilyn Sites, MD Primary Gastroenterologist:  Dr. Oneida Alar  Pre-Procedure History & Physical: HPI:  Jocelyn Love is a 71 y.o. female here for DYSPHAGIA before but not now/FEDA. TONGUE PAIN.  Past Medical History:  Diagnosis Date  . Anemia   . Chronic nausea   . Depression   . Headache    Past Surgical History:  Procedure Laterality Date  . ABDOMINAL HYSTERECTOMY    . BLADDER SURGERY    . BREAST SURGERY     augmentation    Prior to Admission medications   Medication Sig Start Date End Date Taking? Authorizing Provider  cefUROXime (CEFTIN) 500 MG tablet Take 1 tablet (500 mg total) by mouth 2 (two) times daily. 11/25/16 12/05/16 Yes Tat, Shanon Brow, MD  cholecalciferol (VITAMIN D) 1000 units tablet Take 2,000 Units by mouth daily.    Yes [provider]  methocarbamol (ROBAXIN) 500 MG tablet Take 1 tablet (500 mg total) by mouth 4 (four) times daily. 11/25/16  Yes Tat, Shanon Brow, MD  pantoprazole (PROTONIX) 40 MG tablet Take 40 mg by mouth daily.   Yes [provider]  polyethylene glycol-electrolytes (TRILYTE) 420 g solution Take 4,000 mLs by mouth as directed. 11/11/16  Yes Kari Kerth L, MD  sertraline (ZOLOFT) 50 MG tablet Take 50 mg by mouth daily.   Yes [provider]  ferrous sulfate 325 (65 FE) MG tablet Take 1 tablet (325 mg total) by mouth 2 (two) times daily with a meal. 11/25/16   Tat, Shanon Brow, MD  vitamin B-12 (CYANOCOBALAMIN) 1000 MCG tablet Take 1,000 mcg by mouth daily.    [provider]    Allergies as of 11/11/2016 - Review Complete 11/11/2016  Allergen Reaction Noted  . Codeine  01/20/2016  . Sulfa antibiotics  01/20/2016    Family History  Problem Relation Age of Onset  . Colon cancer Neg Hx     Social History   Social History  . Marital status: Widowed    Spouse name: N/A  . Number of children: N/A  . Years of education: N/A   Occupational History  . Not on file.   Social History Main  Topics  . Smoking status: Former Smoker    Types: Cigarettes  . Smokeless tobacco: Never Used  . Alcohol use No  . Drug use: No  . Sexual activity: Not on file   Other Topics Concern  . Not on file   Social History Narrative  . No narrative on file    Review of Systems: See HPI, otherwise negative ROS   Physical Exam: BP 130/81   Pulse 90   Temp 98.8 F (37.1 C) (Oral)   Resp 15   Ht 5\' 3"  (1.6 m)   Wt 126 lb (57.2 kg)   SpO2 97%   BMI 22.32 kg/m  General:   Alert,  pleasant and cooperative in NAD Head:  Normocephalic and atraumatic. Neck:  Supple; Lungs:  Clear throughout to auscultation.    Heart:  Regular rate and rhythm. Abdomen:  Soft, nontender and nondistended. Normal bowel sounds, without guarding, and without rebound.   Neurologic:  Alert and  oriented x4;  grossly normal neurologically.  Impression/Plan:     DYSpepsia/FEDA  PLAN:  EGD/TCS TODAY. DISCUSSED PROCEDURE, BENEFITS, & RISKS: < 1% chance of medication reaction, bleeding, perforation, or rupture of spleen/liver.

## 2016-12-23 NOTE — Telephone Encounter (Signed)
Containers and orders at front for pick up.

## 2016-12-23 NOTE — Interval H&P Note (Signed)
History and Physical Interval Note:  12/23/2016 11:40 AM  Jocelyn Love  has presented today for surgery, with the diagnosis of Iron deficiency anemia  The various methods of treatment have been discussed with the patient and family. After consideration of risks, benefits and other options for treatment, the patient has consented to  Procedure(s) with comments: ESOPHAGOGASTRODUODENOSCOPY (EGD) (N/A) - 2:30pm GIVENS CAPSULE STUDY (N/A) as a surgical intervention .  The patient's history has been reviewed, patient examined, no change in status, stable for surgery.  I have reviewed the patient's chart and labs.  Questions were answered to the patient's satisfaction.     [employer]

## 2016-12-23 NOTE — Assessment & Plan Note (Signed)
NED STOOL SAMPLES.

## 2016-12-24 ENCOUNTER — Other Ambulatory Visit: Payer: Self-pay | Admitting: Gastroenterology

## 2016-12-24 DIAGNOSIS — R197 Diarrhea, unspecified: Secondary | ICD-10-CM | POA: Diagnosis not present

## 2016-12-25 ENCOUNTER — Encounter (HOSPITAL_COMMUNITY): Payer: Self-pay | Admitting: Gastroenterology

## 2016-12-27 MED ORDER — METRONIDAZOLE 500 MG PO TABS: ORAL_TABLET | ORAL | 0 refills | Status: DC

## 2016-12-27 NOTE — Telephone Encounter (Signed)
I PERSONALLY REVIEWED PRECAUTIONS WITH PT AND SENT VIA MYCHART. PT VOICED HER UNDERSTANDING. RECOMMENDED PT REFRAIN FROM SOCIAL INTERACTION FOR 48 HRS.  Hand-Washing Hand-washing is the No. 1 preventative measure in stopping the spread and infection of C. Diff. It is important to wash hands with warm water and anti-bacterial soap for at least 30 seconds. Alcohol-based hand sanitizers are not effective against C. Diff and will not kill spores that may be on the hands.  Isolation Any person who is infected with C. Diff should remain in a separate bedroom and ideally have a separate bathroom for at least 48 hours after they have stopped having diarrhea during treatment. When you are around a person with this infection, it is important to wear gloves and, if possible, a yellow isolation gown to prevent picking up the spores from their environment. Wash your hands immediately upon leaving their room to kill any spores that you may have accidentally picked up. Separate Hygiene Items The person infected with C. Diff should have their own hygiene items. Do not share washcloths, hand towels, toothbrushes, or linens with an infected person. Linens and clothes should be washed separately from anyone else's clothes with detergent and on the hot setting of the washer and dryer. Daily Cleaning of Affected Areas It is important to clean any areas that the infected person comes into contact with daily. Bathrooms should be cleaned after every visit. Mix 1 part of bleach with 10 parts of water as a cleaner and use on toilet bowls, sinks, sink and toilet handles and doorknobs. Vacuum carpets daily and mop any hard floors with the bleach solution used for bathroom cleansing to kill spores that are in the environment

## 2016-12-27 NOTE — Addendum Note (Signed)
Addended by: Danie Binder on: 12/27/2016 10:15 AM   Modules accepted: Orders

## 2016-12-30 ENCOUNTER — Telehealth: Payer: Self-pay | Admitting: Gastroenterology

## 2016-12-30 ENCOUNTER — Other Ambulatory Visit: Payer: Self-pay

## 2016-12-30 DIAGNOSIS — K31819 Angiodysplasia of stomach and duodenum without bleeding: Secondary | ICD-10-CM

## 2016-12-30 DIAGNOSIS — D509 Iron deficiency anemia, unspecified: Secondary | ICD-10-CM

## 2016-12-30 NOTE — Procedures (Signed)
  PRE-OPERATIVE DIAGNOSIS:  IRON DEFICIENCY ANEMIA.  POST-OPERATIVE DIAGNOSIS:  Arteriovenous malformation of duodenum  PROCEDURE:  Procedure(s): GIVENS CAPSULE STUDY PLACED VIA EGD  SURGEON:  Surgeon(s): Dorothyann Peng, MD  PATIENT DATA: GASTRIC PASSAGE TIME: 0 m, SB PASSAGE TIME: [place] 99m  RESULTS: LIMITED views of gastric mucosa due to retained contents. No blood in the stomach. Multiple ulcers seen in terminal ileum begining 84% of SB PT and extending to the IC valve (98%). Occasional small amount of red blood seen. No masses or AVMs. LIMITED VIEWS OF THE COLON DUE TO RETAINED CONTENTS.  DIAGNOSIS: Arteriovenous malformation of duodenum  Plan: 1. FE SUPPLEMENTS FOR LIFE 2. ASSESS BENEFITS V. RISKS OF ANTIPLATELET THERAPY OR ANTICOAGULATION. 3.CBC/FERRITIN IN 3 MOS. SUBMIT 1 WEEK PRIOR TO NEXT GI OPV,

## 2016-12-30 NOTE — Telephone Encounter (Signed)
PT is aware.

## 2016-12-30 NOTE — Telephone Encounter (Signed)
PLEASE CALL PT. HER GIVENS CAPSULE STUDY SHOWS SMALL BOWEL AVMs. NO OTHER SOURCE FOR HER ANEMIA WAS IDENTIFIED.  Plan: 1. FE SUPPLEMENTS FOR LIFE 2.CBC/FERRITIN IN 3 MOS. SUBMIT 1 WEEK PRIOR TO NEXT GI OPV.

## 2016-12-31 NOTE — Telephone Encounter (Signed)
REMINDER IN EPIC/OV MADE

## 2017-01-05 ENCOUNTER — Ambulatory Visit (INDEPENDENT_AMBULATORY_CARE_PROVIDER_SITE_OTHER): Payer: Medicare Other | Admitting: Otolaryngology

## 2017-01-05 DIAGNOSIS — K1321 Leukoplakia of oral mucosa, including tongue: Secondary | ICD-10-CM

## 2017-01-08 LAB — CLOSTRIDIUM DIFFICILE BY PCR: Toxigenic C. Difficile by PCR: POSITIVE — AB

## 2017-01-08 LAB — FECAL LACTOFERRIN, QUANT: Lactoferrin, Fecal, Quant.: 9.52 ug/mL(g) — ABNORMAL HIGH (ref 0.00–7.24)

## 2017-01-08 LAB — PANCREATIC ELASTASE, FECAL: Pancreatic Elastase, Fecal: >500 ug Elast./g (ref >200)

## 2017-01-08 LAB — GIARDIA LAMBLIA ANTIBODY, IFA: GIARDIA AG STL: NEGATIVE

## 2017-01-17 DIAGNOSIS — H2513 Age-related nuclear cataract, bilateral: Secondary | ICD-10-CM | POA: Diagnosis not present

## 2017-02-05 ENCOUNTER — Other Ambulatory Visit: Payer: Self-pay

## 2017-02-05 DIAGNOSIS — D509 Iron deficiency anemia, unspecified: Secondary | ICD-10-CM

## 2017-02-23 ENCOUNTER — Ambulatory Visit (INDEPENDENT_AMBULATORY_CARE_PROVIDER_SITE_OTHER): Payer: Medicare Other | Admitting: Otolaryngology

## 2017-02-23 DIAGNOSIS — K122 Cellulitis and abscess of mouth: Secondary | ICD-10-CM | POA: Diagnosis not present

## 2017-03-10 ENCOUNTER — Ambulatory Visit (INDEPENDENT_AMBULATORY_CARE_PROVIDER_SITE_OTHER): Payer: Medicare Other | Admitting: Gastroenterology

## 2017-03-10 ENCOUNTER — Telehealth: Payer: Self-pay | Admitting: Gastroenterology

## 2017-03-10 ENCOUNTER — Encounter: Payer: Self-pay | Admitting: Gastroenterology

## 2017-03-10 VITALS — BP 122/74 | HR 79 | Temp 98.9°F | Ht 63.0 in | Wt 129.6 lb

## 2017-03-10 DIAGNOSIS — D500 Iron deficiency anemia secondary to blood loss (chronic): Secondary | ICD-10-CM | POA: Diagnosis not present

## 2017-03-10 DIAGNOSIS — K5520 Angiodysplasia of colon without hemorrhage: Secondary | ICD-10-CM | POA: Insufficient documentation

## 2017-03-10 DIAGNOSIS — K558 Other vascular disorders of intestine: Secondary | ICD-10-CM

## 2017-03-10 MED ORDER — METRONIDAZOLE 500 MG PO TABS
500.0000 mg | ORAL_TABLET | Freq: Three times a day (TID) | ORAL | 0 refills | Status: DC
Start: 2017-03-10 — End: 2018-02-15

## 2017-03-10 NOTE — Telephone Encounter (Signed)
Patient saw [facility] this morning and called to let her know that the antibiotic she is taking is called Clindamycin

## 2017-03-10 NOTE — Progress Notes (Addendum)
REVIEWED-NO ADDITIONAL RECOMMENDATIONS.   Primary Care Physician: Sharilyn Sites, MD  Primary Gastroenterologist:  Barney Drain, MD   Chief Complaint  Patient presents with  . Follow-up    no complaints    HPI: Jocelyn Love is a 71 y.o. female here for follow-up of iron deficiency anemia. She was initially seen back in June. At the time she had chronic nausea with intermittent vomiting which was the reason for the visit. Also taking BC powders at least 4 daily for headache. Labs revealed significant iron deficiency anemia with hemoglobin of 8.2, symptomatic. She was transfused.  Initial workup included EGD with colonoscopy on 11/26/2016. She had nonobstructing Schatzki ring at the GE junction, mild gastritis, single small nonbleeding angiectasia in the gastric antrum and one in the second portion of duodenum. Gastric biopsies benign with no H. pylori. Small bowel biopsies negative for celiac. Colonoscopy revealed 10 mm polyp in the cecum, carpet-like (tubular adenoma), 4 mm polyp sigmoid colon, internal hemorrhoids. Next colonoscopy in 3 years. 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 Small bowel capsule placed. Multiple ulcers seen in the terminal ileum extending into the ileocecal valve, occasional small amount of red blood seen. AVM of the duodenum. Advised her supplements for life. Previously advised to stop BC powders and all inserts.  Patient also had C. difficile in July, treated with Flagyl.  Patient presents today stating that she feels well. Her diarrhea resolved. She's been dealing with an abscessed tooth and is started back on clindamycin last week, 7 day supply. Denies diarrhea. No blood in the stool or melena. No upper GI symptoms. No longer on Protonix.   Current Outpatient Prescriptions  Medication Sig Dispense Refill  . acetaminophen (TYLENOL) 500 MG tablet  Take 1,000 mg by mouth 2 (two) times daily as needed for moderate pain.    . Ascorbic Acid (VITAMIN C) 1000 MG tablet Take 1,000 mg by mouth daily.    . Cholecalciferol (VITAMIN D3) 2000 units capsule Take 2,000 Units by mouth daily.    . clindamycin (CLEOCIN) 300 MG capsule Take 300 mg by mouth 3 (three) times daily.    . ferrous sulfate 325 (65 FE) MG tablet Take 1 tablet (325 mg total) by mouth 2 (two) times daily with a meal. (Patient taking differently: Take 325 mg by mouth 2 (two) times daily. ) 60 tablet 0  . gabapentin (NEURONTIN) 300 MG capsule Take 300 mg by mouth 3 (three) times daily.     . Glucosamine HCl (GLUCOSAMINE PO) Take 2 tablets by mouth daily.    . Multiple Vitamin (MULTIVITAMIN WITH MINERALS) TABS tablet Take 1 tablet by mouth daily.    . sertraline (ZOLOFT) 50 MG tablet Take 50 mg by mouth daily.    . TURMERIC PO Take 2 tablets by mouth daily.    .        No current facility-administered medications for this visit.     Allergies as of 03/10/2017 - Review Complete 03/10/2017  Allergen Reaction Noted  . Codeine Rash 01/20/2016  . Sulfa antibiotics Rash 01/20/2016    ROS:  General: Negative for anorexia, weight loss, fever, chills, fatigue, weakness. ENT: Negative for hoarseness, difficulty swallowing , nasal congestion. CV: Negative for chest pain, angina, palpitations, dyspnea on exertion, peripheral edema.  Respiratory: Negative for dyspnea at rest, dyspnea on exertion, cough, sputum, wheezing.  GI: See history of present illness. GU:  Negative for  dysuria, hematuria, urinary incontinence, urinary frequency, nocturnal urination.  Endo: Negative for unusual weight change.    Physical Examination:   BP 122/74   Pulse 79   Temp 98.9 F (37.2 C) (Oral)   Ht 5\' 3"  (1.6 m)   Wt 129 lb 9.6 oz (58.8 kg)   BMI 22.96 kg/m   General: Well-nourished, well-developed in no acute distress.  Eyes: No icterus. Mouth: Oropharyngeal mucosa moist and pink , no lesions  erythema or exudate. Lungs: Clear to auscultation bilaterally.  Heart: Regular rate and rhythm, no murmurs rubs or gallops.  Abdomen: Bowel sounds are normal, nontender, nondistended, no hepatosplenomegaly or masses, no abdominal bruits or hernia , no rebound or guarding.   Extremities: No lower extremity edema. No clubbing or deformities. Neuro: Alert and oriented x 4   Skin: Warm and dry, no jaundice.   Psych: Alert and cooperative, normal mood and affect.

## 2017-03-10 NOTE — Telephone Encounter (Signed)
Forwarding to [place].

## 2017-03-10 NOTE — Assessment & Plan Note (Signed)
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 Patient no longer aspirin or anti-inflammatory products. She feels well. Of note her diarrhea was due to C. difficile back in July, resolved. Unfortunately she is back on antibiotics for abscessed tooth, clindamycin. She has not had her blood work done for iron deficiency anemia.  Continue iron indefinitely. Repeat labs now. Flagyl 500 mg 3 times a day for 10 days to try and prevent recurrent C. difficile in this patient. She will call if she has any problems. Further recommendations pending labs.

## 2017-03-10 NOTE — Telephone Encounter (Signed)
Aware. 

## 2017-03-10 NOTE — Progress Notes (Signed)
cc'ed to pcp °

## 2017-03-10 NOTE — Patient Instructions (Signed)
1. Please have your labs done at [facility]. We will contact you with results as available. 2. Start metronidazole, 1 tablet 3 times daily with food for 10 days to try to prevent C. Diff infection.  3. Stay on iron indefinitely.

## 2017-03-11 LAB — CBC WITH DIFFERENTIAL/PLATELET
BASOS ABS: 0 10*3/uL (ref 0.0–0.2)
BASOS: 0 %
EOS (ABSOLUTE): 0.5 10*3/uL — AB (ref 0.0–0.4)
Eos: 5 %
HEMOGLOBIN: 12.6 g/dL (ref 11.1–15.9)
Hematocrit: 37.9 % (ref 34.0–46.6)
IMMATURE GRANS (ABS): 0 10*3/uL (ref 0.0–0.1)
IMMATURE GRANULOCYTES: 0 %
LYMPHS: 31 %
Lymphocytes Absolute: 2.9 10*3/uL (ref 0.7–3.1)
MCH: 30.3 pg (ref 26.6–33.0)
MCHC: 33.2 g/dL (ref 31.5–35.7)
MCV: 91 fL (ref 79–97)
MONOCYTES: 9 %
Monocytes Absolute: 0.8 10*3/uL (ref 0.1–0.9)
NEUTROS ABS: 5 10*3/uL (ref 1.4–7.0)
NEUTROS PCT: 55 %
PLATELETS: 375 10*3/uL (ref 150–379)
RBC: 4.16 x10E6/uL (ref 3.77–5.28)
RDW: 14.6 % (ref 12.3–15.4)
WBC: 9.2 10*3/uL (ref 3.4–10.8)

## 2017-03-11 LAB — FERRITIN: FERRITIN: 108 ng/mL (ref 15–150)

## 2017-03-11 NOTE — Progress Notes (Signed)
Cbc and ferritin normal.  Continue iron as before.  Repeat CBC, ferritin in four months.  OV with [facility] only in four months.

## 2017-03-12 ENCOUNTER — Other Ambulatory Visit: Payer: Self-pay

## 2017-03-12 DIAGNOSIS — D649 Anemia, unspecified: Secondary | ICD-10-CM

## 2017-03-12 NOTE — Progress Notes (Signed)
Pt is aware. Lab orders on file for 07/12/2017. Forwarding to [place] to schedule the OV with Dr. Oneida Alar in 4 months.

## 2017-04-27 DIAGNOSIS — E782 Mixed hyperlipidemia: Secondary | ICD-10-CM | POA: Diagnosis not present

## 2017-04-27 DIAGNOSIS — Z0001 Encounter for general adult medical examination with abnormal findings: Secondary | ICD-10-CM | POA: Diagnosis not present

## 2017-04-27 DIAGNOSIS — Z6823 Body mass index (BMI) 23.0-23.9, adult: Secondary | ICD-10-CM | POA: Diagnosis not present

## 2017-04-27 DIAGNOSIS — R7309 Other abnormal glucose: Secondary | ICD-10-CM | POA: Diagnosis not present

## 2017-04-27 DIAGNOSIS — M1991 Primary osteoarthritis, unspecified site: Secondary | ICD-10-CM | POA: Diagnosis not present

## 2017-06-03 ENCOUNTER — Encounter: Payer: Self-pay | Admitting: Gastroenterology

## 2017-06-22 ENCOUNTER — Other Ambulatory Visit: Payer: Self-pay

## 2017-06-22 DIAGNOSIS — D649 Anemia, unspecified: Secondary | ICD-10-CM

## 2017-06-30 DIAGNOSIS — Z0001 Encounter for general adult medical examination with abnormal findings: Secondary | ICD-10-CM | POA: Diagnosis not present

## 2017-06-30 DIAGNOSIS — D649 Anemia, unspecified: Secondary | ICD-10-CM | POA: Diagnosis not present

## 2017-06-30 DIAGNOSIS — Z1389 Encounter for screening for other disorder: Secondary | ICD-10-CM | POA: Diagnosis not present

## 2017-06-30 DIAGNOSIS — E782 Mixed hyperlipidemia: Secondary | ICD-10-CM | POA: Diagnosis not present

## 2017-07-01 LAB — CBC WITH DIFFERENTIAL/PLATELET
BASOS ABS: 0 10*3/uL (ref 0.0–0.2)
Basos: 0 %
EOS (ABSOLUTE): 0.2 10*3/uL (ref 0.0–0.4)
Eos: 3 %
Hematocrit: 39.4 % (ref 34.0–46.6)
Hemoglobin: 13.6 g/dL (ref 11.1–15.9)
Immature Grans (Abs): 0 10*3/uL (ref 0.0–0.1)
Immature Granulocytes: 0 %
LYMPHS ABS: 3.4 10*3/uL — AB (ref 0.7–3.1)
Lymphs: 40 %
MCH: 31.9 pg (ref 26.6–33.0)
MCHC: 34.5 g/dL (ref 31.5–35.7)
MCV: 93 fL (ref 79–97)
MONOS ABS: 0.7 10*3/uL (ref 0.1–0.9)
Monocytes: 8 %
NEUTROS ABS: 4.1 10*3/uL (ref 1.4–7.0)
Neutrophils: 49 %
PLATELETS: 378 10*3/uL (ref 150–379)
RBC: 4.26 x10E6/uL (ref 3.77–5.28)
RDW: 12.6 % (ref 12.3–15.4)
WBC: 8.5 10*3/uL (ref 3.4–10.8)

## 2017-07-01 LAB — FERRITIN: FERRITIN: 153 ng/mL — AB (ref 15–150)

## 2017-07-06 NOTE — Progress Notes (Signed)
Please let patient know her Hgb is normal. Her ferritin is slightly elevated. Would have her stop her iron for now.   Keep upcoming ov with slf next month.  Repeat CBC, ferritin in 3 months.

## 2017-07-07 ENCOUNTER — Other Ambulatory Visit: Payer: Self-pay

## 2017-07-07 DIAGNOSIS — D509 Iron deficiency anemia, unspecified: Secondary | ICD-10-CM

## 2017-07-07 DIAGNOSIS — D649 Anemia, unspecified: Secondary | ICD-10-CM | POA: Diagnosis not present

## 2017-07-07 DIAGNOSIS — R7309 Other abnormal glucose: Secondary | ICD-10-CM | POA: Diagnosis not present

## 2017-07-07 NOTE — Progress Notes (Signed)
PT is aware. Lab orders on file for 3 months.

## 2017-07-08 LAB — FERRITIN: Ferritin: 164 ng/mL — ABNORMAL HIGH (ref 15–150)

## 2017-07-14 NOTE — Progress Notes (Signed)
[name], I'm not sure why there is another ferritin, she just had one two weeks ago.   Let's make sure she still gets a CBC, ferritin in 3 months as planned.

## 2017-07-14 NOTE — Progress Notes (Signed)
Future labs are on file for 10/04/2017 for CBC and Ferritin.

## 2017-08-06 ENCOUNTER — Ambulatory Visit: Payer: Medicare Other | Admitting: Gastroenterology

## 2017-08-06 ENCOUNTER — Encounter: Payer: Self-pay | Admitting: Gastroenterology

## 2017-08-06 DIAGNOSIS — K558 Other vascular disorders of intestine: Secondary | ICD-10-CM

## 2017-08-06 DIAGNOSIS — K146 Glossodynia: Secondary | ICD-10-CM | POA: Diagnosis not present

## 2017-08-06 DIAGNOSIS — D500 Iron deficiency anemia secondary to blood loss (chronic): Secondary | ICD-10-CM

## 2017-08-06 DIAGNOSIS — K5520 Angiodysplasia of colon without hemorrhage: Secondary | ICD-10-CM

## 2017-08-06 NOTE — Progress Notes (Signed)
Subjective:    Patient ID: Jocelyn Love, female    DOB: Dec 15, 1945, 72 y.o.   MRN: [id number]  Jocelyn Sites, MD   HPI HAVING TROUBLE WITH TONGUE BIOPSY BY [place]. ALSO HAVING TROUBLE WITH SALIVA BEING THICK. LOST MY ABILITY TO SMELL AND TASTE. WAS GIVEN GABAPENTIN AND HELPED RESTLESS LEGS BUT NOT TONGUE. TONGUE NOT AS BAD. TAKES A MVI EVERY DAY. DOESN'T HAVE HEARTBURN. HAS  HIATAL HERNIA. IN PAST MO TROUBLE SWALLOWING AND WENT BACK TO OMEPRAZOLE AND THAT HAS CLEARED UP. IRON CAUSED LOOSE STOOLS. HAD SOME BREATHING PROBLEMS BUT IT'S BETTER. LOST WEIGHT BUT GAINED IT BACK AND IT DISSATISFIED. BMS: 1-2X/DAY(NL)  PT DENIES FEVER, CHILLS, HEMATOCHEZIA, nausea, vomiting, melena, diarrhea, CHEST PAIN, CHANGE IN BOWEL IN HABITS, abdominal pain, CONSTIPATION, problems swallowing, problems with sedation, OR heartburn or indigestion. WEIGHT OCT 2018: 129 LBS.  Past Medical History:  Diagnosis Date  . Anemia   . Chronic nausea   . Depression   . Headache     Past Surgical History:  Procedure Laterality Date  . ABDOMINAL HYSTERECTOMY    . BIOPSY TONGUE     yesterday   . BLADDER SURGERY    . BREAST SURGERY     augmentation  . COLONOSCOPY N/A 11/26/2016   Procedure: COLONOSCOPY;  Surgeon: Danie Binder, MD;  [facility]: [facility];  Service: Endoscopy;  Laterality: N/A;  10:15am - office advised patient to arrive at 3:00  . ESOPHAGOGASTRODUODENOSCOPY N/A 11/26/2016   Procedure: ESOPHAGOGASTRODUODENOSCOPY (EGD);  Surgeon: Danie Binder, MD;  Location: [facility];  Service: Endoscopy;  Laterality: N/A;  . ESOPHAGOGASTRODUODENOSCOPY N/A 12/23/2016   Procedure: ESOPHAGOGASTRODUODENOSCOPY (EGD);  Surgeon: Danie Binder, MD;  Location: [facility];  Service: Endoscopy;  Laterality: N/A;  2:30pm  . GIVENS CAPSULE STUDY N/A 12/23/2016   Procedure: GIVENS CAPSULE STUDY;  Surgeon: Danie Binder, MD;  Location: [facility];  Service: Endoscopy;  Laterality: N/A;  . SAVORY DILATION N/A  11/26/2016   Procedure: SAVORY DILATION;  Surgeon: Danie Binder, MD;  Location: [facility];  Service: Endoscopy;  Laterality: N/A;    Allergies  Allergen Reactions  . Codeine Rash  . Sulfa Antibiotics Rash    Current Outpatient Medications  Medication Sig Dispense Refill  . acetaminophen (TYLENOL) 500 MG tablet Take 1,000 mg by mouth 2 (two) times daily as needed for moderate pain.    . Ascorbic Acid (VITAMIN C) 1000 MG tablet Take 1,000 mg by mouth daily.    . Cholecalciferol (VITAMIN D3) 2000 units capsule Take 2,000 Units by mouth daily.    Marland Kitchen gabapentin (NEURONTIN) 300 MG capsule Take 100 mg by mouth 3 (three) times daily.     . Glucosamine HCl (GLUCOSAMINE PO) Take 2 tablets by mouth daily.    . Multiple Vitamin (MULTIVITAMIN WITH MINERALS) TABS tablet Take 1 tablet by mouth daily.    Marland Kitchen omeprazole (PRILOSEC) 20 MG capsule Take 20 mg by mouth daily.    . sertraline (ZOLOFT) 50 MG tablet Take 50 mg by mouth daily.    . TURMERIC PO Take 2 tablets by mouth daily.    .      .      .       Review of Systems PER HPI OTHERWISE ALL SYSTEMS ARE NEGATIVE.    Objective:   Physical Exam  Constitutional: She is oriented to person, place, and time. She appears well-developed and well-nourished. No distress.  HENT:  Head: Normocephalic and atraumatic.  Mouth/Throat: Oropharynx is clear and moist. No oropharyngeal exudate.  Left tongue  W/o ulcer or erythema, tender to touch  Eyes: Pupils are equal, round, and reactive to light. No scleral icterus.  Neck: Normal range of motion. Neck supple.  Cardiovascular: Normal rate, regular rhythm and normal heart sounds.  Pulmonary/Chest: Effort normal and breath sounds normal. No respiratory distress.  Abdominal: Soft. Bowel sounds are normal. She exhibits no distension. There is no tenderness.  Musculoskeletal: She exhibits no edema.  Lymphadenopathy:    She has no cervical adenopathy.  Neurological: She is alert and oriented to person,  place, and time.  NO FOCAL DEFICITS  Psychiatric: She has a normal mood and affect.  Vitals reviewed.     Assessment & Plan:

## 2017-08-06 NOTE — Progress Notes (Signed)
ON RECALL  °

## 2017-08-06 NOTE — Progress Notes (Signed)
cc'ed to pcp °

## 2017-08-06 NOTE — Assessment & Plan Note (Signed)
DUE TO AVMS IN SETTING OF ASA USE. SYMPTOMS CONTROLLED/RESOLVED.  CONTINUE TO MONITOR SYMPTOMS.

## 2017-08-06 NOTE — Patient Instructions (Addendum)
Follow UP WITH DR. Benjamine Mola.  CONTINUE OMEPRAZOLE.  TAKE 30 MINUTES PRIOR TO YOUR FIRST MEAL.  CONTINUE TO STRICTLY AVOID ASPIRIN, BC/GOODY POWDERS, IBUPROFEN/MOTRIN, OR NAPROXEN/ALEVE BECAUSE YOU HAVE A HISTORY OF ANEMIA.  FOLLOW UP IN 6 MOS. YOU SHOULD HAVE YOUR BLOOD COUNT CHECKED TWICE A YEAR.  PLEASE CALL WITH QUESTIONS OR CONCERNS.

## 2017-08-06 NOTE — Assessment & Plan Note (Addendum)
AVOIDING ASA/NSAIDs. Hb NL IN JAN 2019.  CONTINUE TO MONITOR SYMPTOMS. NEEDS CBC Q6MOS. FOLLOW UP IN 6 MOS.

## 2017-08-06 NOTE — Assessment & Plan Note (Signed)
ETIOLOGY [facility];EAR BUT ASSOCIATED WITH LOSS OF TASTE AND SMALL AND FEELING LIKE SALIVA IS THICK.  SEE DR. Benjamine Mola. DISCUSSED WITH PT REFERRAL TO [facility] AND SHE DECLINES AT THIS TIME. DEFINITELY NOT INTERESTED IN HAVING ANOTHER TONGUE BIOPSY. FOLLOW UP IN 6 MOS.

## 2017-09-04 ENCOUNTER — Other Ambulatory Visit: Payer: Self-pay

## 2017-09-04 DIAGNOSIS — D509 Iron deficiency anemia, unspecified: Secondary | ICD-10-CM

## 2017-09-10 ENCOUNTER — Telehealth: Payer: Self-pay | Admitting: Gastroenterology

## 2017-09-10 NOTE — Telephone Encounter (Signed)
Pt received a letter about having her lab work done. She is going to [facility] on [address] and wants the orders to go there.

## 2017-09-10 NOTE — Telephone Encounter (Signed)
Spoke with pt and she has a copy of the lab order. Pt is aware that when she goes into [facility], they will also be able to see the orders.

## 2017-09-11 ENCOUNTER — Other Ambulatory Visit (HOSPITAL_COMMUNITY): Payer: Self-pay | Admitting: Family Medicine

## 2017-09-11 ENCOUNTER — Ambulatory Visit (HOSPITAL_COMMUNITY)
Admission: RE | Admit: 2017-09-11 | Discharge: 2017-09-11 | Disposition: A | Discharge status: Home / Self Care | Payer: Medicare Other | Source: Ambulatory Visit | Attending: Family Medicine | Admitting: Family Medicine

## 2017-09-11 DIAGNOSIS — R0602 Shortness of breath: Secondary | ICD-10-CM | POA: Diagnosis not present

## 2017-09-11 DIAGNOSIS — J069 Acute upper respiratory infection, unspecified: Secondary | ICD-10-CM | POA: Insufficient documentation

## 2017-09-11 DIAGNOSIS — Z1389 Encounter for screening for other disorder: Secondary | ICD-10-CM | POA: Diagnosis not present

## 2017-09-11 DIAGNOSIS — Z6824 Body mass index (BMI) 24.0-24.9, adult: Secondary | ICD-10-CM | POA: Diagnosis not present

## 2017-09-11 DIAGNOSIS — R05 Cough: Secondary | ICD-10-CM | POA: Diagnosis not present

## 2017-10-29 DIAGNOSIS — D509 Iron deficiency anemia, unspecified: Secondary | ICD-10-CM | POA: Diagnosis not present

## 2017-10-30 LAB — CBC WITH DIFFERENTIAL/PLATELET
BASOS: 0 %
Basophils Absolute: 0 10*3/uL (ref 0.0–0.2)
EOS (ABSOLUTE): 0.2 10*3/uL (ref 0.0–0.4)
EOS: 2 %
HEMATOCRIT: 37.2 % (ref 34.0–46.6)
Hemoglobin: 12.3 g/dL (ref 11.1–15.9)
IMMATURE GRANS (ABS): 0.1 10*3/uL (ref 0.0–0.1)
IMMATURE GRANULOCYTES: 1 %
LYMPHS: 37 %
Lymphocytes Absolute: 4.9 10*3/uL — ABNORMAL HIGH (ref 0.7–3.1)
MCH: 30.7 pg (ref 26.6–33.0)
MCHC: 33.1 g/dL (ref 31.5–35.7)
MCV: 93 fL (ref 79–97)
MONOCYTES: 6 %
Monocytes Absolute: 0.8 10*3/uL (ref 0.1–0.9)
NEUTROS PCT: 54 %
Neutrophils Absolute: 7.2 10*3/uL — ABNORMAL HIGH (ref 1.4–7.0)
PLATELETS: 428 10*3/uL (ref 150–450)
RBC: 4.01 x10E6/uL (ref 3.77–5.28)
RDW: 12.2 % — ABNORMAL LOW (ref 12.3–15.4)
WBC: 13.2 10*3/uL — ABNORMAL HIGH (ref 3.4–10.8)

## 2017-10-30 LAB — FERRITIN: Ferritin: 110 ng/mL (ref 15–150)

## 2017-11-09 ENCOUNTER — Other Ambulatory Visit: Payer: Self-pay

## 2017-11-09 DIAGNOSIS — D509 Iron deficiency anemia, unspecified: Secondary | ICD-10-CM

## 2017-11-09 NOTE — Progress Notes (Signed)
Pt is aware of results. She has not had any respiratory or urinary symptoms. She said the only thing that she has had is sores on her tongue and no one has found out what causes them. Said she had even had biopsies. She is gong to stop taking her Multivitamin and Vit B and see if it helps. She is aware she will need labs prior to [place] in Sept. Lab orders on file.  Sending FYI to [place] in Leslie's absence.

## 2017-11-12 NOTE — Progress Notes (Signed)
Forwarding to Leslie Lewis, PA for FYI.  

## 2017-11-13 DIAGNOSIS — Z6824 Body mass index (BMI) 24.0-24.9, adult: Secondary | ICD-10-CM | POA: Diagnosis not present

## 2017-11-13 DIAGNOSIS — J300 Vasomotor rhinitis: Secondary | ICD-10-CM | POA: Diagnosis not present

## 2017-11-13 DIAGNOSIS — Z1389 Encounter for screening for other disorder: Secondary | ICD-10-CM | POA: Diagnosis not present

## 2017-11-13 DIAGNOSIS — J310 Chronic rhinitis: Secondary | ICD-10-CM | POA: Diagnosis not present

## 2017-11-17 NOTE — Progress Notes (Signed)
Pt is aware.  

## 2017-11-26 ENCOUNTER — Ambulatory Visit (INDEPENDENT_AMBULATORY_CARE_PROVIDER_SITE_OTHER): Payer: Medicare Other | Admitting: Otolaryngology

## 2017-11-26 DIAGNOSIS — K1230 Oral mucositis (ulcerative), unspecified: Secondary | ICD-10-CM

## 2017-12-18 ENCOUNTER — Encounter: Payer: Self-pay | Admitting: Gastroenterology

## 2017-12-21 ENCOUNTER — Telehealth: Payer: Self-pay | Admitting: Gastroenterology

## 2017-12-21 ENCOUNTER — Other Ambulatory Visit: Payer: Self-pay

## 2017-12-21 ENCOUNTER — Telehealth: Payer: Self-pay

## 2017-12-21 DIAGNOSIS — R200 Anesthesia of skin: Secondary | ICD-10-CM

## 2017-12-21 DIAGNOSIS — D509 Iron deficiency anemia, unspecified: Secondary | ICD-10-CM | POA: Diagnosis not present

## 2017-12-21 DIAGNOSIS — D649 Anemia, unspecified: Secondary | ICD-10-CM | POA: Diagnosis not present

## 2017-12-21 NOTE — Addendum Note (Signed)
Addended by: Zara Council C on: 12/21/2017 11:30 AM   Modules accepted: Orders

## 2017-12-21 NOTE — Telephone Encounter (Signed)
Jocelyn Love is aware the lab orders were sent to [facility]. She said to please refer pt to [facility] neurology. Also, FYI for Dr. Oneida Alar: she said Dr. Melene Plan has referred her to ENT specialist in [facility]

## 2017-12-21 NOTE — Telephone Encounter (Signed)
Jocelyn Love, I accidentally released pt's future labs this Am ( CBC and Ferritin).  They are in lab box to mail to pt in September when due. Just wanted to [facility] you on this.

## 2017-12-21 NOTE — Telephone Encounter (Addendum)
PLEASE CALL PT'S DAUGHTER AFTER YOU FAX ORDERS FOR B12 AND FOLATE LEVELS TO THE LAB. ASK WHICH NEUROLOGY PRACTICE SHE WOULD LIKE TO SEE: [place] OR [place]

## 2017-12-21 NOTE — Telephone Encounter (Signed)
REVIEWED-NO ADDITIONAL RECOMMENDATIONS. 

## 2017-12-21 NOTE — Telephone Encounter (Signed)
Noted  

## 2017-12-21 NOTE — Telephone Encounter (Signed)
Referral sent to [facility] via Epic.

## 2017-12-22 LAB — CBC WITH DIFFERENTIAL/PLATELET
BASOS ABS: 29 {cells}/uL (ref 0–200)
Basophils Relative: 0.3 %
EOS ABS: 230 {cells}/uL (ref 15–500)
Eosinophils Relative: 2.4 %
HEMATOCRIT: 37.3 % (ref 35.0–45.0)
Hemoglobin: 12.6 g/dL (ref 11.7–15.5)
LYMPHS ABS: 3091 {cells}/uL (ref 850–3900)
MCH: 30.3 pg (ref 27.0–33.0)
MCHC: 33.8 g/dL (ref 32.0–36.0)
MCV: 89.7 fL (ref 80.0–100.0)
MPV: 9.7 fL (ref 7.5–12.5)
Monocytes Relative: 6.5 %
NEUTROS ABS: 5626 {cells}/uL (ref 1500–7800)
NEUTROS PCT: 58.6 %
Platelets: 306 10*3/uL (ref 140–400)
RBC: 4.16 10*6/uL (ref 3.80–5.10)
RDW: 12.9 % (ref 11.0–15.0)
Total Lymphocyte: 32.2 %
WBC mixed population: 624 cells/uL (ref 200–950)
WBC: 9.6 10*3/uL (ref 3.8–10.8)

## 2017-12-22 LAB — FERRITIN: Ferritin: 117 ng/mL (ref 16–288)

## 2017-12-22 LAB — FOLATE RBC: RBC FOLATE: 963 ng/mL (ref >280)

## 2017-12-22 LAB — VITAMIN B12: Vitamin B-12: >2000 pg/mL — ABNORMAL HIGH (ref 200–1100)

## 2017-12-22 NOTE — Telephone Encounter (Signed)
PT is aware.

## 2017-12-29 DIAGNOSIS — K1370 Unspecified lesions of oral mucosa: Secondary | ICD-10-CM | POA: Diagnosis not present

## 2018-01-02 IMAGING — CT CT HEAD W/O CM
3 series · 16 of 45 positions shown, 19 images · non-contrast
Comparison: None.

CLINICAL DATA: Dizziness.

EXAM:
CT HEAD WITHOUT CONTRAST
TECHNIQUE: Contiguous axial images were obtained from the base of the skull
through the vertex without intravenous contrast.

[Series 2: head wo · axial · 0.41mm/px · z∈[+1488,+1603]mm · 10 of 28 slices shown, 13 images]
[im 3/28  brain]
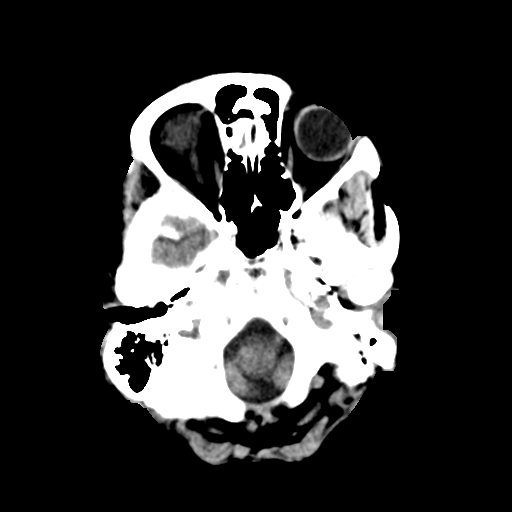
[im 3/28  bone]
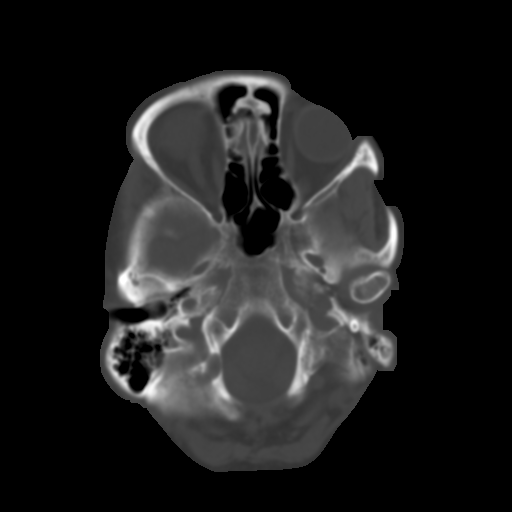
[im 5/28  brain]
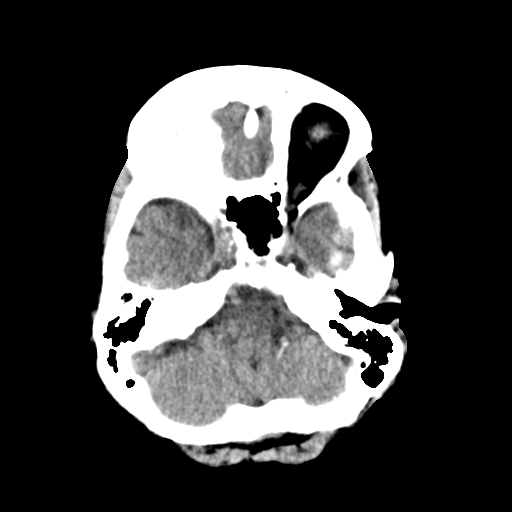
[im 8/28  brain]
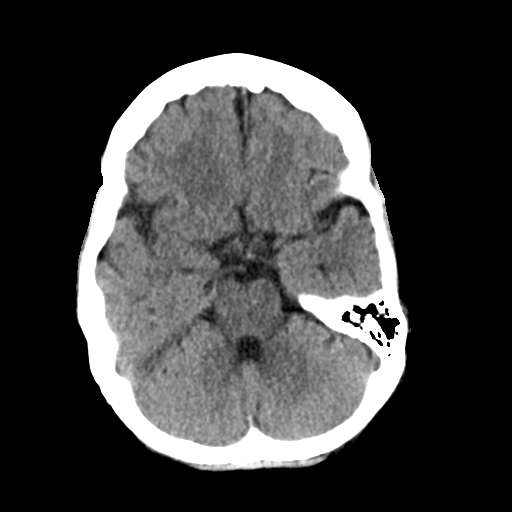
[im 11/28  brain]
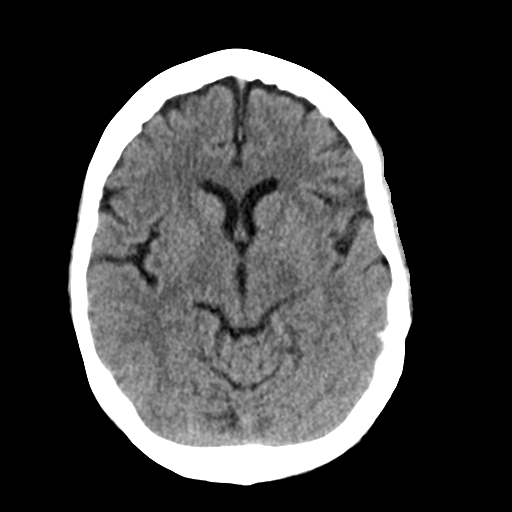
[im 13/28  brain]
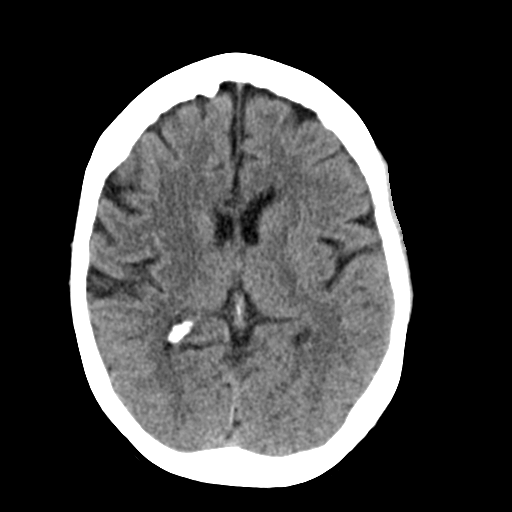
[im 13/28  bone]
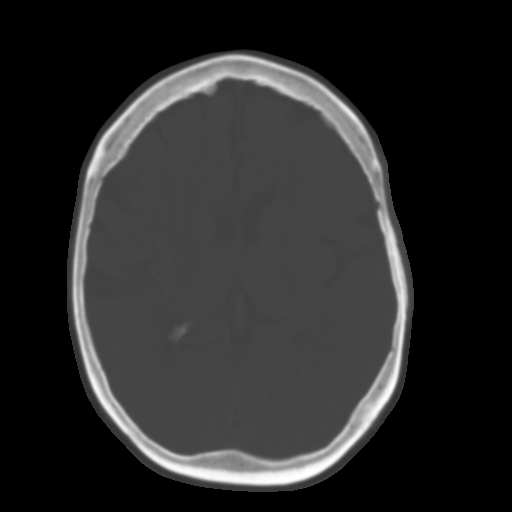
[im 16/28  brain]
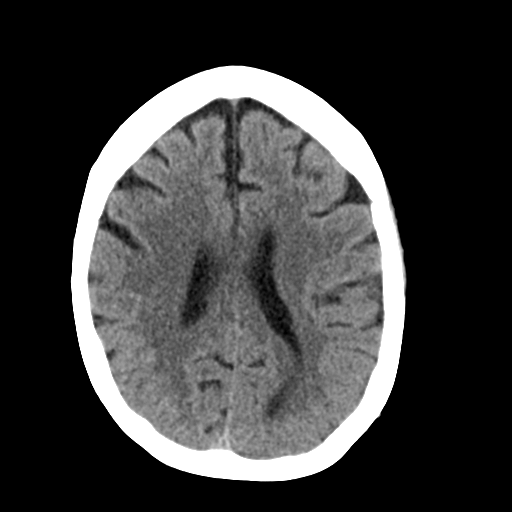
[im 18/28  brain]
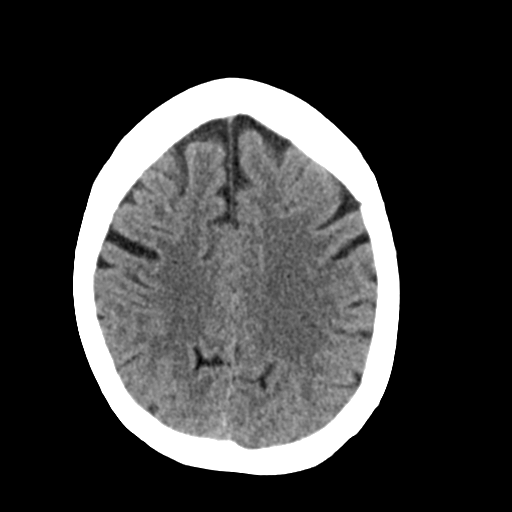
[im 21/28  brain]
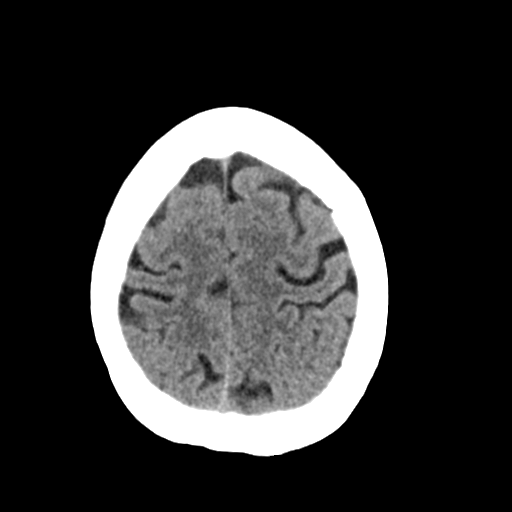
[im 24/28  brain]
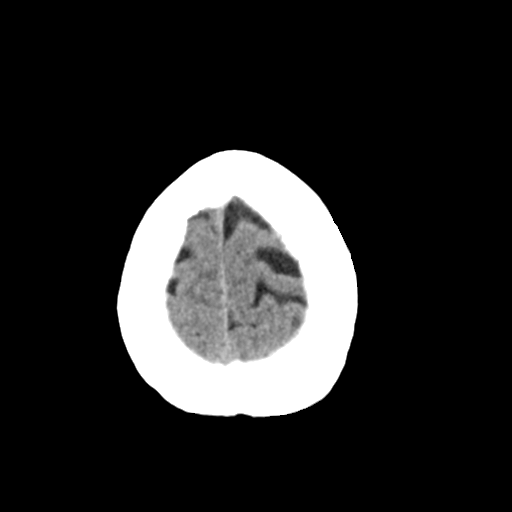
[im 24/28  bone]
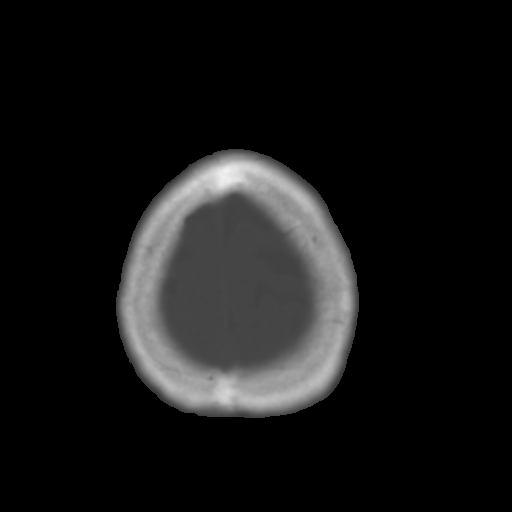
[im 26/28  brain]
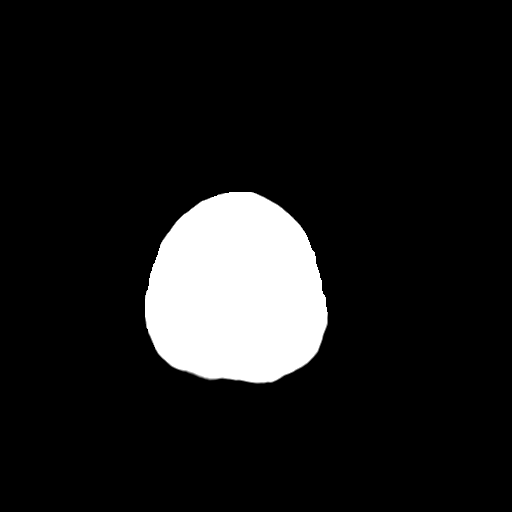

[Series 4: coronal soft tissue · coronal · 0.34mm/px · 3 of 66 slices shown]
[im 22/66  brain]
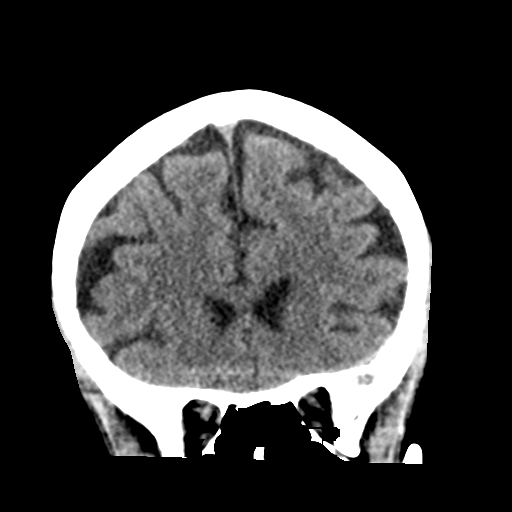
[im 29/66  brain]
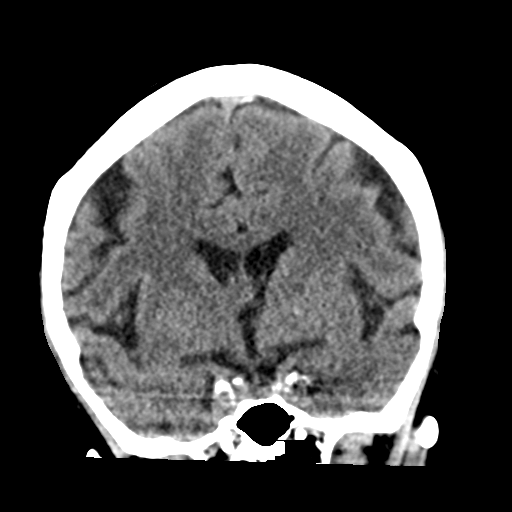
[im 37/66  brain]
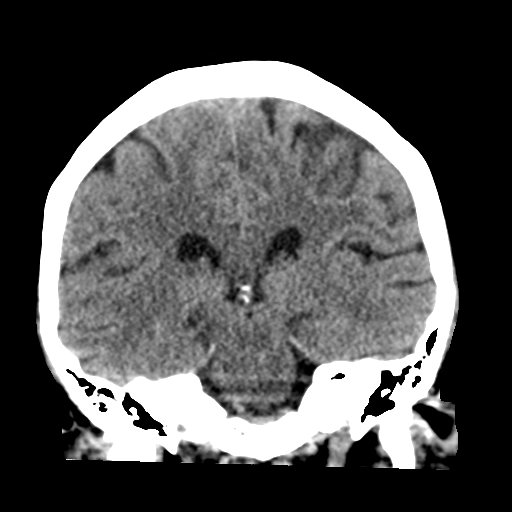

[Series 5: sagittal soft tissue · sagittal · 0.31mm/px · 3 of 54 slices shown]
[im 18/54  brain]
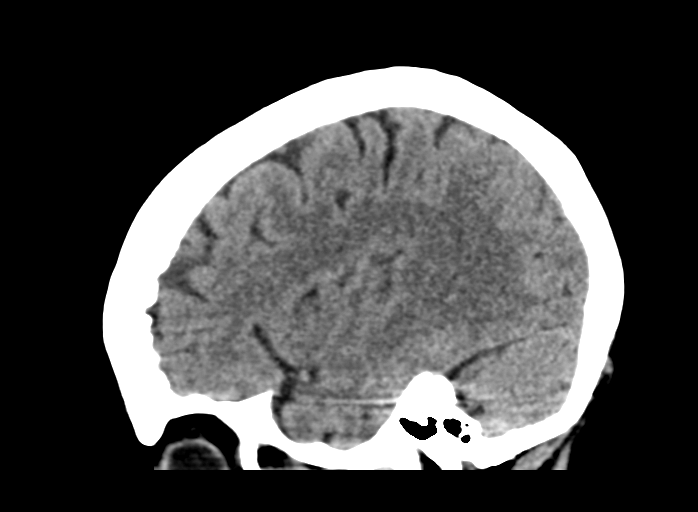
[im 27/54  brain]
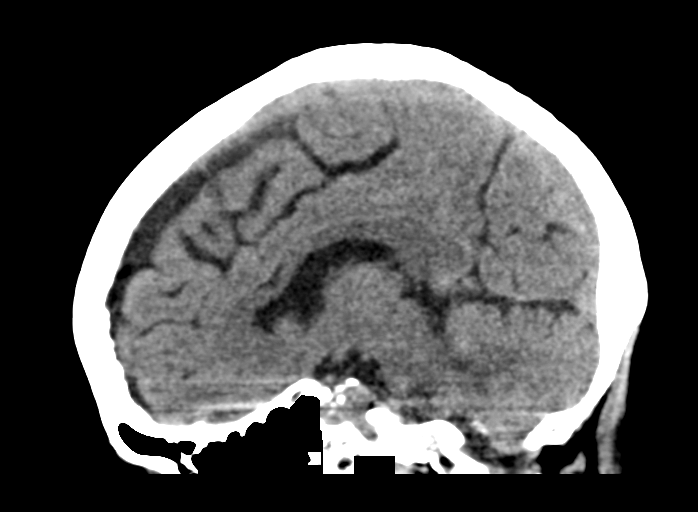
[im 36/54  brain]
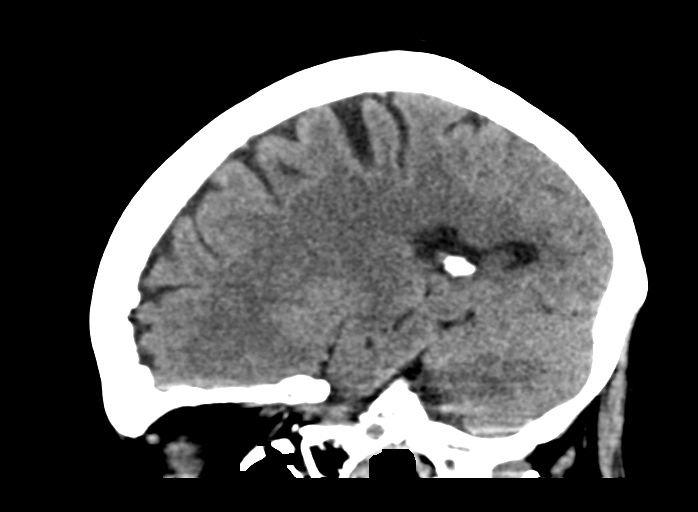

[16 of 45 positions shown; findings below may reference images not displayed]

FINDINGS: Brain: No subdural, epidural, or subarachnoid hemorrhage identified.
Cerebellum, brainstem, and basal cisterns are normal. Ventricles and
sulci are unremarkable. No acute cortical ischemia or infarct. No
mass, mass effect, or midline shift.

Vascular: No hyperdense vessel or unexpected calcification.

Skull: Normal. Negative for fracture or focal lesion.

Sinuses/Orbits: No acute finding.

Other: None.
IMPRESSION: No cause for dizziness identified. No acute intracranial
abnormality.

## 2018-01-07 ENCOUNTER — Encounter: Payer: Self-pay | Admitting: Gastroenterology

## 2018-01-07 DIAGNOSIS — K140 Glossitis: Secondary | ICD-10-CM | POA: Diagnosis not present

## 2018-01-07 DIAGNOSIS — K089 Disorder of teeth and supporting structures, unspecified: Secondary | ICD-10-CM | POA: Diagnosis not present

## 2018-01-21 DIAGNOSIS — K140 Glossitis: Secondary | ICD-10-CM | POA: Diagnosis not present

## 2018-01-21 DIAGNOSIS — K089 Disorder of teeth and supporting structures, unspecified: Secondary | ICD-10-CM | POA: Diagnosis not present

## 2018-01-26 DIAGNOSIS — H2513 Age-related nuclear cataract, bilateral: Secondary | ICD-10-CM | POA: Diagnosis not present

## 2018-02-15 ENCOUNTER — Encounter

## 2018-02-15 ENCOUNTER — Encounter: Payer: Self-pay | Admitting: Neurology

## 2018-02-15 ENCOUNTER — Ambulatory Visit: Payer: Medicare Other | Admitting: Neurology

## 2018-02-15 VITALS — BP 138/95 | HR 77 | Ht 62.0 in | Wt 134.0 lb

## 2018-02-15 DIAGNOSIS — K146 Glossodynia: Secondary | ICD-10-CM

## 2018-02-15 NOTE — Progress Notes (Signed)
Subjective:    Patient ID: Jocelyn Love is a 72 y.o. female.  HPI     Star Age, MD, PhD [facility] [address], [address] [place], [place] [address]  Dear Dr. Oneida Alar,  I saw your patient, Jocelyn Love, upon your kind request in my neurologic clinic today for initial consultation of her tongue sores. The patient is accompanied by her daughter today. As you know, [name] is a 72 year old right-handed woman with an underlying medical history of anemia, headache, depression, chronic nausea, hiatal hernia and restless leg syndrome, who reports over one year history of recurrent tongue sores. She has seen her dentist for this, she has also seen an ENT specialist at [facility] twice for this. She has a longer standing history of loss of sense of smell and taste nearly 5 years ago. She reports having had a brain MRI at the time but I could not see the results and her sister more outside records for her brain MRI. I reviewed your office note from 08/06/2017. She had biopsy of the tongue through ENT, Dr. Benjamine Mola.  In the past, she had a head CT without contrast on 11/23/2016 and I reviewed the results: IMPRESSION: No cause for dizziness identified. No acute intracranial abnormality.   She had recent lab work through your office including ferritin, CBC with differential, folate and B12 level with unremarkable findings with the exception of elevated WBC and elevated B12 level. She was advised to stop her multivitamin. She was seen by ENT at [facility] recently. She was advised that her tongue lesions could be secondary to poor dentition and sharper teeth edges. She had a recent tooth extraction about a month ago and reports that the sore on the right side of her tongue is better. Her daughter reports that it looks much improved since the tooth extraction. Sores have been painful. She denies any numbness or tingling of the tongue or face. She has had some recurrent headaches  but they are milder. She has been on gabapentin for her soreness of the tongue and while it did not help her tongue pain, it helped her restless leg symptoms. She is on 100 mg 3 times a day but typically takes 200 mg in the morning and 100 mg at night. She quit smoking in 1997, she does not utilize alcohol and drinks caffeine in the form of coffee, 2-3 cups per day on average. She lives with her grandson and his wife. She has 2 grown children. She does not work. She denies any one-sided weakness or numbness or tingling. She denies facial symptoms or abnormal movements. She feels that her saliva gets sticky and thicker by the end of the day and her symptoms regarding tongue pain and soreness are worse by nighttime. She reports that she hydrates well enough. Her daughter feels that she is not hydrating well enough with water. Patient is not sure how much water she actually drinks per day. She has no history of seizure or convulsion, no history of bowel or bladder incontinence or severe tongue bite at night. She has no family history of Parkinson's disease.  Her Past Medical History Is Significant For: Past Medical History:  Diagnosis Date  . Anemia   . Chronic nausea   . Depression   . Headache     Her Past Surgical History Is Significant For: Past Surgical History:  Procedure Laterality Date  . ABDOMINAL HYSTERECTOMY    . BIOPSY TONGUE     yesterday   .  BLADDER SURGERY    . BREAST SURGERY     augmentation  . COLONOSCOPY N/A 11/26/2016   Procedure: COLONOSCOPY;  Surgeon: Danie Binder, MD;  [facility];  Service: Endoscopy;  Laterality: N/A;  10:15am - office advised patient to arrive at 3:00  . ESOPHAGOGASTRODUODENOSCOPY N/A 11/26/2016   Procedure: ESOPHAGOGASTRODUODENOSCOPY (EGD);  Surgeon: Danie Binder, MD;  [facility];  Service: Endoscopy;  Laterality: N/A;  . ESOPHAGOGASTRODUODENOSCOPY N/A 12/23/2016   Procedure: ESOPHAGOGASTRODUODENOSCOPY (EGD);  Surgeon:  Danie Binder, MD;  [facility];  Service: Endoscopy;  Laterality: N/A;  2:30pm  . GIVENS CAPSULE STUDY N/A 12/23/2016   Procedure: GIVENS CAPSULE STUDY;  Surgeon: Danie Binder, MD;  [facility];  Service: Endoscopy;  Laterality: N/A;  . SAVORY DILATION N/A 11/26/2016   Procedure: SAVORY DILATION;  Surgeon: Danie Binder, MD;  [facility];  Service: Endoscopy;  Laterality: N/A;    Her Family History Is Significant For: Family History  Problem Relation Age of Onset  . Colon cancer Neg Hx     Her Social History Is Significant For: Social History   Socioeconomic History  . Marital status: Widowed    Spouse name: Not on file  . Number of children: Not on file  . Years of education: Not on file  . Highest education level: Not on file  Occupational History  . Not on file  Social Needs  . Financial resource strain: Not on file  . Food insecurity:    Worry: Not on file    Inability: Not on file  . Transportation needs:    Medical: Not on file    Non-medical: Not on file  Tobacco Use  . Smoking status: Former Smoker    Types: Cigarettes  . Smokeless tobacco: Never Used  Substance and Sexual Activity  . Alcohol use: No  . Drug use: No  . Sexual activity: Not on file  Lifestyle  . Physical activity:    Days per week: Not on file    Minutes per session: Not on file  . Stress: Not on file  Relationships  . Social connections:    Talks on phone: Not on file    Gets together: Not on file    Attends religious service: Not on file    Active member of club or organization: Not on file    Attends meetings of clubs or organizations: Not on file    Relationship status: Not on file  Other Topics Concern  . Not on file  Social History Narrative  . Not on file    Her Allergies Are:  Allergies  Allergen Reactions  . Codeine Rash  . Sulfa Antibiotics Rash  :   Her Current Medications Are:  Outpatient Encounter Medications as of  02/15/2018  Medication Sig  . acetaminophen (TYLENOL) 500 MG tablet Take 1,000 mg by mouth 2 (two) times daily as needed for moderate pain.  . Ascorbic Acid (VITAMIN C) 1000 MG tablet Take 1,000 mg by mouth daily.  . Cholecalciferol (VITAMIN D3) 2000 units capsule Take 2,000 Units by mouth daily.  . ferrous sulfate 325 (65 FE) MG tablet Take 1 tablet (325 mg total) by mouth 2 (two) times daily with a meal.  . gabapentin (NEURONTIN) 300 MG capsule Take 100 mg by mouth 3 (three) times daily.   . Glucosamine HCl (GLUCOSAMINE PO) Take 2 tablets by mouth daily.  . Multiple Vitamin (MULTIVITAMIN WITH MINERALS) TABS tablet  Take 1 tablet by mouth daily.  Marland Kitchen omeprazole (PRILOSEC) 20 MG capsule Take 20 mg by mouth daily.  . sertraline (ZOLOFT) 50 MG tablet Take 50 mg by mouth daily.  . TURMERIC PO Take 2 tablets by mouth daily.  . [DISCONTINUED] clindamycin (CLEOCIN) 300 MG capsule Take 300 mg by mouth 3 (three) times daily.  . [DISCONTINUED] metroNIDAZOLE (FLAGYL) 500 MG tablet Take 1 tablet (500 mg total) by mouth 3 (three) times daily. With food (Patient not taking: Reported on 08/06/2017)   No facility-administered encounter medications on file as of 02/15/2018.   : Review of Systems:  Out of a complete 14 point review of systems, all are reviewed and negative with the exception of these symptoms as listed below:  Review of Systems  Neurological:       Pt presents today to discuss her tongue. Pt has sores on her tongue. Pt has seen an ENT at [facility] twice with no conclusions.    Objective:  Neurological Exam  Physical Exam Physical Examination:   Vitals:   02/15/18 1411  BP: (!) 138/95  Pulse: 77    General Examination: The patient is a very pleasant 73 y.o. female in no acute distress. She appears well-developed and well-nourished and well groomed.   HEENT: Normocephalic, atraumatic, pupils are equal, round and reactive to light and accommodation. Funduscopic exam is normal with sharp disc  margins noted. Extraocular tracking is good without limitation to gaze excursion or nystagmus noted. Normal smooth pursuit is noted. Hearing is grossly intact. Tympanic membranes are clear bilaterally. Face is symmetric with normal facial animation and normal facial sensation. Speech is clear with no dysarthria noted. There is no hypophonia. There is no lip, neck/head, jaw or voice tremor. Neck is supple with full range of passive and active motion. There are no carotid bruits on auscultation. Oropharynx exam reveals: moderate mouth dryness, marginal dental hygiene and no significant airway crowding. Tongue protrudes centrally and palate elevates symmetrically. Tongue movements are normal, no abnormal facial dyskinesias or tongue movements are seen. She does have an area of healing scar in the right anterior part of the tongue. She also has a smaller lesion in the left posterior part of the tongue and evidence of a small area of tongue bite in the right posterior tongue. She has no ulcerations, no blisters.  Chest: Clear to auscultation without wheezing, rhonchi or crackles noted.  Heart: S1+S2+0, regular and normal without murmurs, rubs or gallops noted.   Abdomen: Soft, non-tender and non-distended with normal bowel sounds appreciated on auscultation.  Extremities: There is no pitting edema in the distal lower extremities bilaterally.  Skin: Warm and dry without trophic changes noted.  Musculoskeletal: exam reveals no obvious joint deformities, tenderness or joint swelling or erythema.   Neurologically:  Mental status: The patient is awake, alert and oriented in all 4 spheres. Her immediate and remote memory, attention, language skills and fund of knowledge are appropriate. There is no evidence of aphasia, agnosia, apraxia or anomia. Speech is clear with normal prosody and enunciation. Thought process is linear. Mood is normal and affect is normal.  Cranial nerves II - XII are as described above  under HEENT exam. In addition: shoulder shrug is normal with equal shoulder height noted. Motor exam: Normal bulk, strength and tone is noted. There is no drift, tremor or rebound. Romberg is negative. Reflexes are 2+ throughout. Fine motor skills and coordination: intact with normal finger taps, normal hand movements, normal rapid alternating patting, normal foot  taps and normal foot agility.  Cerebellar testing: No dysmetria or intention tremor on finger to nose testing. Heel to shin is unremarkable bilaterally. There is no truncal or gait ataxia.  Sensory exam: intact to light touch in the upper and lower extremities.  Gait, station and balance: She stands easily. No veering to one side is noted. No leaning to one side is noted. Posture is age-appropriate and stance is narrow based. Gait shows normal stride length and normal pace. No problems turning are noted. Tandem walk is unremarkable.   Assessment and Plan:   In summary, DELMI FULFER is a very pleasant 72 y.o.-year old female with an underlying medical history of anemia, headache, depression, chronic nausea, hiatal hernia and restless leg syndrome, who presents for evaluation of her recurrent tongue sores. On examination she has nonspecific scarlike tissue on the sides of her tongue, primarily in the right anterior portion of the tongue and in the mid to posterior area on the left. She had recent tooth extractions, most recently about a month ago. After that, the sore on the right side improved quite a bit per daughter's report and also by patient's feedback. From a neurological standpoint, she has a nonfocal examination. I do not see any specific neurological reason for her to have tongue sores. In particular, upon further questioning, there is no concern for seizure activity and tongue bites at night. She had a brain MRI some years ago, results are not available for my review today. I do not see a pressing reason to repeat her imaging test at  this juncture. She was reassured that her neurological exam is nonfocal. She is encouraged to stay well hydrated with water as she may not be drinking enough water. She is going to seek further dental evaluation and care. From my end of things I suggested as needed follow-up. I answered all their questions today and the patient and her daughter were in agreement. Thank you very much for allowing me to participate in the care of this nice patient. If I can be of any further assistance to you please do not hesitate to call me at [phone].  Sincerely,   Star Age, MD, PhD

## 2018-02-15 NOTE — Patient Instructions (Addendum)
Your neurological exam is non-focal. I do not see an underlying neurological reason for your tongue sores, in particular, your tongue, soft palate and facial exam is normal. You do seem you have 2 healing areas of soreness, no obvious ulcers.  Mouth is on the dry side. Please try to hydrate well with water.  I don't think an MRI brain will be needed as your exam is benign.  I will see you back as needed.

## 2018-03-09 ENCOUNTER — Telehealth: Payer: Self-pay | Admitting: Gastroenterology

## 2018-03-09 NOTE — Telephone Encounter (Signed)
PATIENT SCHEDULED FOR FOLLOW UP IN January AND STATED SHE IS SUPPOSED TO ALSO HAVE LABS, I TOLD HER THE NURSE WOULD BE IN CONTACT OR SHE WOULD RECEIVE THE LAB ORDER IN THE MAIL

## 2018-03-09 NOTE — Telephone Encounter (Signed)
Lmom, waiting on a return call.  

## 2018-03-10 NOTE — Telephone Encounter (Addendum)
Lab orders and letter were mailed to her 01/12/18. Called patient and stated she did receive her lab orders but didn't know what it was. I made her aware to go ahead and have her lab work done.

## 2018-03-12 ENCOUNTER — Other Ambulatory Visit: Payer: Self-pay | Admitting: Gastroenterology

## 2018-03-12 DIAGNOSIS — D509 Iron deficiency anemia, unspecified: Secondary | ICD-10-CM | POA: Diagnosis not present

## 2018-03-13 LAB — CBC WITH DIFFERENTIAL/PLATELET
BASOS ABS: 68 {cells}/uL (ref 0–200)
BASOS PCT: 0.7 %
EOS ABS: 330 {cells}/uL (ref 15–500)
Eosinophils Relative: 3.4 %
HCT: 37.8 % (ref 35.0–45.0)
Hemoglobin: 12.9 g/dL (ref 11.7–15.5)
Lymphs Abs: 3967 cells/uL — ABNORMAL HIGH (ref 850–3900)
MCH: 30.1 pg (ref 27.0–33.0)
MCHC: 34.1 g/dL (ref 32.0–36.0)
MCV: 88.1 fL (ref 80.0–100.0)
MPV: 10.2 fL (ref 7.5–12.5)
Monocytes Relative: 8.5 %
NEUTROS PCT: 46.5 %
Neutro Abs: 4511 cells/uL (ref 1500–7800)
PLATELETS: 333 10*3/uL (ref 140–400)
RBC: 4.29 10*6/uL (ref 3.80–5.10)
RDW: 12.9 % (ref 11.0–15.0)
TOTAL LYMPHOCYTE: 40.9 %
WBC: 9.7 10*3/uL (ref 3.8–10.8)
WBCMIX: 825 {cells}/uL (ref 200–950)

## 2018-03-13 LAB — FERRITIN: Ferritin: 77 ng/mL (ref 16–288)

## 2018-03-23 ENCOUNTER — Other Ambulatory Visit: Payer: Self-pay

## 2018-03-23 DIAGNOSIS — D509 Iron deficiency anemia, unspecified: Secondary | ICD-10-CM

## 2018-03-23 NOTE — Progress Notes (Signed)
Pt is aware and lab orders on file for April 2020.

## 2018-05-06 ENCOUNTER — Encounter: Payer: Self-pay | Admitting: Gastroenterology

## 2018-05-11 ENCOUNTER — Encounter: Payer: Self-pay | Admitting: Nutrition

## 2018-05-11 DIAGNOSIS — E782 Mixed hyperlipidemia: Secondary | ICD-10-CM | POA: Diagnosis not present

## 2018-05-11 DIAGNOSIS — Z0001 Encounter for general adult medical examination with abnormal findings: Secondary | ICD-10-CM | POA: Diagnosis not present

## 2018-05-11 DIAGNOSIS — Z23 Encounter for immunization: Secondary | ICD-10-CM | POA: Diagnosis not present

## 2018-05-11 DIAGNOSIS — Z6824 Body mass index (BMI) 24.0-24.9, adult: Secondary | ICD-10-CM | POA: Diagnosis not present

## 2018-05-11 DIAGNOSIS — M1991 Primary osteoarthritis, unspecified site: Secondary | ICD-10-CM | POA: Diagnosis not present

## 2018-05-11 DIAGNOSIS — E559 Vitamin D deficiency, unspecified: Secondary | ICD-10-CM | POA: Diagnosis not present

## 2018-05-11 DIAGNOSIS — Z1389 Encounter for screening for other disorder: Secondary | ICD-10-CM | POA: Diagnosis not present

## 2018-05-11 DIAGNOSIS — R51 Headache: Secondary | ICD-10-CM | POA: Diagnosis not present

## 2018-06-09 ENCOUNTER — Ambulatory Visit: Payer: Medicare Other | Admitting: Gastroenterology

## 2018-07-21 ENCOUNTER — Ambulatory Visit: Payer: Medicare Other | Admitting: Gastroenterology

## 2018-07-21 ENCOUNTER — Encounter: Payer: Self-pay | Admitting: Gastroenterology

## 2018-07-21 DIAGNOSIS — K219 Gastro-esophageal reflux disease without esophagitis: Secondary | ICD-10-CM | POA: Diagnosis not present

## 2018-07-21 DIAGNOSIS — E785 Hyperlipidemia, unspecified: Secondary | ICD-10-CM | POA: Diagnosis not present

## 2018-07-21 DIAGNOSIS — K5520 Angiodysplasia of colon without hemorrhage: Secondary | ICD-10-CM

## 2018-07-21 NOTE — Patient Instructions (Addendum)
I WILL OBTAIN YOUR PRIOR labs from DR. GOLDING. CONTINUE IRON AND MAY DECREASE TO ONCE A DAY.  SEE NUTRITION FOR EDUCATION ON LOW FAT PESCATARIAN(SEAFOOD ONLY) DIET. IT WILL BE BETTER FOR YOUR CHOLESTEROL.   AVOID REFLUX TRIGGERS. SEE INFO BELOW.  CONTINUE OMEPRAZOLE.  TAKE 30 MINUTES PRIOR TO YOUR FIRST MEAL.  FOLLOW UP IN 6 MOS TO ONE YEAR.  Lifestyle and home remedies TO CONTROL HEARTBURN You may eliminate or reduce the frequency of heartburn by making the following lifestyle changes:  . Control your weight. Being overweight is a major risk factor for heartburn and GERD. Excess pounds put pressure on your abdomen, pushing up your stomach and causing acid to back up into your esophagus.   . Eat smaller meals. 4 TO 6 MEALS A DAY. This reduces pressure on the lower esophageal sphincter, helping to prevent the valve from opening and acid from washing back into your esophagus.   Dolphus Jenny your belt. Clothes that fit tightly around your waist put pressure on your abdomen and the lower esophageal sphincter.   . Eliminate heartburn triggers. Everyone has specific triggers. Common triggers such as fatty or fried foods, spicy food, tomato sauce, carbonated beverages, alcohol, chocolate, mint, garlic, onion, caffeine and nicotine may make heartburn worse.   Marland Kitchen Avoid stooping or bending. Tying your shoes is OK. Bending over for longer periods to weed your garden isn't, especially soon after eating.   . Don't lie down after a meal. Wait at least three to four hours after eating before going to bed, and don't lie down right after eating.   Alternative medicine . Several home remedies exist for treating GERD, but they provide only temporary relief. They include drinking baking soda (sodium bicarbonate) added to water or drinking other fluids such as baking soda mixed with cream of tartar and water. . Although these liquids create temporary relief by neutralizing, washing away or buffering acids,  eventually they aggravate the situation by adding gas and fluid to your stomach, increasing pressure and causing more acid reflux. Further, adding more sodium to your diet may increase your blood pressure and add stress to your heart, and excessive bicarbonate ingestion can alter the acid-base balance in your body.     Marland Kitchen

## 2018-07-21 NOTE — Assessment & Plan Note (Signed)
NOT TAKING MEDS. DESIRES EDUCATION TO CONTROL BY DIET.  SEE NUTRITION FOR EDUCATION ON LOW FAT PESCATARIAN(SEAFOOD ONLY) DIET. IT WILL BE BETTER FOR YOUR CHOLESTEROL. FOLLOW UP IN 6 MOS TO ONE YEAR.

## 2018-07-21 NOTE — Assessment & Plan Note (Signed)
LAST CBC OCT 2019 NL. NO BRBPR OR MELENA. IRON MAY CAUSE LOOSE STOOLS.  CONTINUE TO MONITOR SYMPTOMS. CBC Q6 MOS I WILL OBTAIN YOUR PRIOR labs from DR. GOLDING. CONTINUE IRON AND MAY DECREASE TO ONCE A DAY.  FOLLOW UP IN 6 MOS TO ONE YEAR.

## 2018-07-21 NOTE — Assessment & Plan Note (Signed)
SYMPTOMS CONTROLLED/RESOLVED.  AVOID REFLUX TRIGGERS.  HANDOUT GIVEN. CONTINUE OMEPRAZOLE.  TAKE 30 MINUTES PRIOR TO YOUR FIRST MEAL. FOLLOW UP IN 6 MOS TO ONE YEAR.

## 2018-07-21 NOTE — Progress Notes (Signed)
Subjective:    Patient ID: Jocelyn Love, female    DOB: Jun 24, 1945, 73 y.o.   MRN: [id number]  Sharilyn Sites, MD   HPI Had great holidays and family getting together at the beach this summer. IN April GOING TO [place]. LAST CBC OCT 2019. POSSIBLY HAD BLOOD WORK IN 2020. GAINED 15 LBS. WANTS TO KNOW MORE ABOUT A LOW FAT/PESCATARIAN DIET. DID KETO LAST YEAR. FELT BETTER. WITHOUT BREAD AND SUGAR. NAUSEA IF SHE TAKES ALL HER VITAMINS AT THE SAME TIME. HAS DIARRHEA OFF AND ON. RARE CONSTIPATION. TONGUE IS SO [place]. IT'S FINALLY [place].  PT DENIES FEVER, CHILLS, HEMATOCHEZIA, HEMATEMESIS, nausea, vomiting, melena, diarrhea, CHEST PAIN, SHORTNESS OF BREATH,  CHANGE IN BOWEL IN HABITS, constipation, abdominal pain, problems swallowing, problems with sedation, heartburn or indigestion.  Past Medical History:  Diagnosis Date  . Anemia   . Chronic nausea   . Depression   . Headache    Past Surgical History:  Procedure Laterality Date  . ABDOMINAL HYSTERECTOMY    . BIOPSY TONGUE     yesterday   . BLADDER SURGERY    . BREAST SURGERY     augmentation  . COLONOSCOPY N/A 11/26/2016   Procedure: COLONOSCOPY;  Surgeon: Danie Binder, MD;  [facility];  Service: Endoscopy;  Laterality: N/A;  10:15am - office advised patient to arrive at 3:00  . ESOPHAGOGASTRODUODENOSCOPY N/A 11/26/2016   Procedure: ESOPHAGOGASTRODUODENOSCOPY (EGD);  Surgeon: Danie Binder, MD;  [facility];  Service: Endoscopy;  Laterality: N/A;  . ESOPHAGOGASTRODUODENOSCOPY N/A 12/23/2016   Procedure: ESOPHAGOGASTRODUODENOSCOPY (EGD);  Surgeon: Danie Binder, MD;  [facility];  Service: Endoscopy;  Laterality: N/A;  2:30pm  . GIVENS CAPSULE STUDY N/A 12/23/2016   Procedure: GIVENS CAPSULE STUDY;  Surgeon: Danie Binder, MD;  [facility];  Service: Endoscopy;  Laterality: N/A;  . SAVORY DILATION N/A 11/26/2016   Procedure: SAVORY DILATION;  Surgeon: Danie Binder, MD;  [facility];  Service: Endoscopy;  Laterality: N/A;   Allergies  Allergen Reactions  . Codeine Rash  . Sulfa Antibiotics Rash   Current Outpatient Medications  Medication Sig    . acetaminophen (TYLENOL) 500 MG tablet Take 1,000 mg by mouth 2 (two) times daily as needed for moderate pain.    . ferrous sulfate 325 (65 FE) MG tablet Take 1 tablet (325 mg total) by mouth 2 (two) times daily with a meal.    . gabapentin (NEURONTIN) 300 MG capsule Take 100 mg by mouth 3 (three) times daily.     . Glucosamine HCl (GLUCOSAMINE PO) Take 2 tablets by mouth daily.    Marland Kitchen omeprazole (PRILOSEC) 20 MG capsule Take 20 mg by mouth daily.    . sertraline (ZOLOFT) 50 MG tablet Take 50 mg by mouth daily.    Marland Kitchen topiramate (TOPAMAX) 25 MG capsule Take 25 mg by mouth daily.    . TURMERIC PO Take 2 tablets by mouth daily.    . Ascorbic Acid (VITAMIN C) 1000 MG tablet Take 1,000 mg by mouth daily.    . Cholecalciferol (VITAMIN D3) 2000 units capsule Take 2,000 Units by mouth daily.    . Multiple Vitamin TABS tablet Take 1 tablet by mouth daily.     Review of Systems PER HPI OTHERWISE ALL SYSTEMS ARE NEGATIVE.     Objective:   Physical Exam Vitals signs reviewed.  Constitutional:      General: She is not in  acute distress.    Appearance: She is well-developed.  HENT:     Head: Normocephalic and atraumatic.     Mouth/Throat:     Pharynx: No oropharyngeal exudate.  Eyes:     General: No scleral icterus.    Pupils: Pupils are equal, round, and reactive to light.  Neck:     Musculoskeletal: Normal range of motion and neck supple.  Cardiovascular:     Rate and Rhythm: Normal rate and regular rhythm.     Heart sounds: Normal heart sounds.  Pulmonary:     Effort: Pulmonary effort is normal. No respiratory distress.     Breath sounds: Normal breath sounds.  Abdominal:     General: Bowel sounds are normal. There is no distension.     Palpations: Abdomen is soft.     Tenderness:  There is no abdominal tenderness.  Musculoskeletal:     Right lower leg: No edema.     Left lower leg: No edema.  Lymphadenopathy:     Cervical: No cervical adenopathy.  Neurological:     General: No focal deficit present.     Mental Status: She is alert and oriented to person, place, and time. Mental status is at baseline.  Psychiatric:        Mood and Affect: Mood normal.        Behavior: Behavior normal.     Comments: Normal affect       Assessment & Plan:

## 2018-07-22 ENCOUNTER — Other Ambulatory Visit: Payer: Self-pay

## 2018-07-22 DIAGNOSIS — E785 Hyperlipidemia, unspecified: Secondary | ICD-10-CM

## 2018-07-22 NOTE — Progress Notes (Signed)
cc'ed to pcp °

## 2018-07-22 NOTE — Progress Notes (Signed)
ON RECALL  °

## 2018-08-04 ENCOUNTER — Encounter: Payer: Medicare Other | Attending: Family Medicine | Admitting: Nutrition

## 2018-08-04 VITALS — Ht 63.0 in | Wt 147.0 lb

## 2018-08-04 DIAGNOSIS — E782 Mixed hyperlipidemia: Secondary | ICD-10-CM | POA: Diagnosis not present

## 2018-08-04 DIAGNOSIS — R635 Abnormal weight gain: Secondary | ICD-10-CM | POA: Insufficient documentation

## 2018-08-04 NOTE — Progress Notes (Signed)
  Medical Nutrition Therapy:  Appt start time: 1600 end time:  1700.   Assessment:  Primary concerns today: Hyperlipidemia. Lives by herself and family lives with her. Hyperlipidemia. Sees Dr. Hilma Favors. Gained about 15 lbs in the last 6 months to yr. Wt was 129 lbs 03/2017.  She thinks her  High cholesterol is related to following the KETO diet. Hfas been eating high fat and high salt foods this past year with her family members.  She notes her LDL and TG were high.  Willing to work on changing eating habits and focus more on more fresh fruits and vegetables and whole grains.  This will help with healthy weight loss. CMP Latest Ref Rng & Units 11/25/2016 11/24/2016 11/23/2016  Glucose 65 - 99 mg/dL 111(H) 104(H) 102(H)  BUN 6 - 20 mg/dL 16 13 11   Creatinine 0.44 - 1.00 mg/dL 0.70 0.60 0.61  Sodium 135 - 145 mmol/L 139 137 140  Potassium 3.5 - 5.1 mmol/L 4.0 3.8 3.9  Chloride 101 - 111 mmol/L 107 102 102  CO2 22 - 32 mmol/L 24 26 26   Calcium 8.9 - 10.3 mg/dL 8.8(L) 9.0 9.4  Total Protein 6.0 - 8.5 g/dL - - -  Total Bilirubin 0.0 - 1.2 mg/dL - - -  Alkaline Phos 39 - 117 IU/L - - -  AST 0 - 40 IU/L - - -  ALT 0 - 32 IU/L - - -   Wt Readings from Last 3 Encounters:  08/04/18 147 lb (66.7 kg)  07/21/18 145 lb 6.4 oz (66 kg)  02/15/18 134 lb (60.8 kg)   Ht Readings from Last 3 Encounters:  08/04/18 5\' 3"  (1.6 m)  07/21/18 5\' 3"  (1.6 m)  02/15/18 5\' 2"  (1.575 m)   Body mass index is 26.04 kg/m. @BMIFA @ Facility age limit for growth percentiles is 20 years. Facility age limit for growth percentiles is 20 years.   Preferred Learning Style:    No preference indicated   Learning Readiness:  Ready  Change in progress   MEDICATIONS:  24-hr recall:  B ( AM):  skips Snk ( AM):   L ( PM): Sweet potato, butter, salt, diet sodas,  Snk ( PM):  D ( PM):  Meat, vegetables,  Snk ( PM): misc snack, chips.greek yogurt Beverages: diet   Usual physical activity: walk some.    Estimated energy needs: 1200  calories 135 g carbohydrates 90 g protein 33 g fat  Progress Towards Goal(s):  In progress.   Nutritional Diagnosis:  NI-5.5 Imbalance of nutrients As related to Keto diet.  As evidenced by hyperlipidemia..    Intervention:  High Fiber Low Fat Heart Healthy Lifestyle Benefits of exericise. . Goals Follow HIhg Fiber diet Eat three meals per day Increase fresh fruits and vegetables Exercise 60 minutes 4-5 times per week Drink 4 bottles of water daily  Teaching Method Utilized:  Visual Auditory Hands on  Handouts given during visit include:  The Plate Method   High Cholesterol Handouts.   Barriers to learning/adherence to lifestyle change: none  Demonstrated degree of understanding via:  Teach Back   Monitoring/Evaluation:  Dietary intake, exercise,  and body weight in 1 month(s)

## 2018-08-04 NOTE — Patient Instructions (Addendum)
Goals Follow HIhg Fiber diet Eat three meals per day Increase fresh fruits and vegetables Exercise 60 minutes 4-5 times per week Drink 4 bottles of water daily

## 2018-08-09 ENCOUNTER — Telehealth: Payer: Self-pay | Admitting: Nutrition

## 2018-08-09 NOTE — Telephone Encounter (Signed)
TC to pt to inform her of her most recent lipids from last MD visit. CHOL was 288 mg/dl, TG 169 mg/dl and LDL 190 mg/dl. She verbalized understanding. She notes her MD prescribed her cholesterol medication but she isn't taking it and is going to work on diet first. Strongly advised to follow Heart Healthy Diet as we discussed last visit and consider taking metamucil daily to help lower cholesterol.

## 2018-08-17 ENCOUNTER — Other Ambulatory Visit: Payer: Self-pay

## 2018-08-17 DIAGNOSIS — D509 Iron deficiency anemia, unspecified: Secondary | ICD-10-CM

## 2018-08-25 ENCOUNTER — Encounter: Payer: Self-pay | Admitting: Nutrition

## 2018-11-15 DIAGNOSIS — E7849 Other hyperlipidemia: Secondary | ICD-10-CM | POA: Diagnosis not present

## 2018-11-15 DIAGNOSIS — Z0001 Encounter for general adult medical examination with abnormal findings: Secondary | ICD-10-CM | POA: Diagnosis not present

## 2018-11-15 DIAGNOSIS — E559 Vitamin D deficiency, unspecified: Secondary | ICD-10-CM | POA: Diagnosis not present

## 2018-11-15 DIAGNOSIS — Z1389 Encounter for screening for other disorder: Secondary | ICD-10-CM | POA: Diagnosis not present

## 2018-12-21 ENCOUNTER — Telehealth: Payer: Self-pay | Admitting: Nutrition

## 2018-12-21 ENCOUNTER — Other Ambulatory Visit: Payer: Self-pay

## 2018-12-21 ENCOUNTER — Encounter: Payer: Medicare Other | Attending: Family Medicine | Admitting: Nutrition

## 2018-12-21 ENCOUNTER — Encounter: Payer: Self-pay | Admitting: Nutrition

## 2018-12-21 ENCOUNTER — Encounter: Payer: Self-pay | Admitting: Gastroenterology

## 2018-12-21 DIAGNOSIS — E782 Mixed hyperlipidemia: Secondary | ICD-10-CM | POA: Insufficient documentation

## 2018-12-21 NOTE — Telephone Encounter (Signed)
No answer for phone visit. VM left on home phone.

## 2018-12-21 NOTE — Patient Instructions (Signed)
Goals Eat 3 meals per day in tim-don't skip meals Increase fresh fruits and vegetables. Drink more water. Take cholesterol medication at night for better treatment. Avoid processed and fried foods Keep reading food label Increase high fiber foods.

## 2018-12-21 NOTE — Progress Notes (Signed)
  Medical Nutrition Therapy:  Appt start time: 1015- end time:  1030   Assessment:  Primary concerns today: Hyperlipidemia. Lives by herself and family lives with her. Hyperlipidemia. Hasn't been able to make many changes due to lack of motivation.. Struggles with commitment. Now is taking Vit D 1.25 mg/50.0000 once a week.  Now on cholesterol medication: Rosuvastatin Calcium 10 mg once a day.  She is reading labels and trying to work on portions and vegetables.  She is trying to eat more fish baked and broiled. Has issues with taste and smell issues. Has sores on her tongue. Tends to use a lot salt.  Willing to work harder on higher fiber foods and eat better balanced foods to help lower her cholesterol. Will get labs checked at next MD visit for cholesterol.   CMP Latest Ref Rng & Units 11/25/2016 11/24/2016 11/23/2016  Glucose 65 - 99 mg/dL 111(H) 104(H) 102(H)  BUN 6 - 20 mg/dL 16 13 11   Creatinine 0.44 - 1.00 mg/dL 0.70 0.60 0.61  Sodium 135 - 145 mmol/L 139 137 140  Potassium 3.5 - 5.1 mmol/L 4.0 3.8 3.9  Chloride 101 - 111 mmol/L 107 102 102  CO2 22 - 32 mmol/L 24 26 26   Calcium 8.9 - 10.3 mg/dL 8.8(L) 9.0 9.4  Total Protein 6.0 - 8.5 g/dL - - -  Total Bilirubin 0.0 - 1.2 mg/dL - - -  Alkaline Phos 39 - 117 IU/L - - -  AST 0 - 40 IU/L - - -  ALT 0 - 32 IU/L - - -   Wt Readings from Last 3 Encounters:  08/04/18 147 lb (66.7 kg)  07/21/18 145 lb 6.4 oz (66 kg)  02/15/18 134 lb (60.8 kg)   Ht Readings from Last 3 Encounters:  08/04/18 5\' 3"  (1.6 m)  07/21/18 5\' 3"  (1.6 m)  02/15/18 5\' 2"  (1.575 m)   There is no height or weight on file to calculate BMI. @BMIFA @ Facility age limit for growth percentiles is 20 years. Facility age limit for growth percentiles is 20 years.   Preferred Learning Style:    No preference indicated   Learning Readiness:  Ready  Change in progress   MEDICATIONS:  24-hr recall:  B ( AM):  Skips. Snk ( AM):   L ( skipped.. usually  grazes or leftover. Snk ( PM):  D ( PM): spaghetti, water  Snk ( PM diet   Usual physical activity: walk some.   Estimated energy needs: 1200  calories 135 g carbohydrates 90 g protein 33 g fat  Progress Towards Goal(s):  In progress.   Nutritional Diagnosis:  NI-5.5 Imbalance of nutrients As related to Keto diet.  As evidenced by hyperlipidemia..    Intervention:  High Fiber Low Fat Heart Healthy Lifestyle Benefits of exericise. . Goals Eat 3 meals per day in tim-don't skip meals Increase fresh fruits and vegetables. Drink more water. Take cholesterol medication at night for better treatment. Avoid processed and fried foods Keep reading food label Increase high fiber foods.   Teaching Method Utilized:  Visual Auditory Hands on  Handouts given during visit include:  The Plate Method   High Cholesterol Handouts.   Barriers to learning/adherence to lifestyle change: none  Demonstrated degree of understanding via:  Teach Back Monitoring/Evaluation:  Dietary intake, exercise,  and body weight in 6 month(s)

## 2019-02-05 DIAGNOSIS — H2513 Age-related nuclear cataract, bilateral: Secondary | ICD-10-CM | POA: Diagnosis not present

## 2019-06-02 DIAGNOSIS — E7849 Other hyperlipidemia: Secondary | ICD-10-CM | POA: Diagnosis not present

## 2019-06-21 ENCOUNTER — Ambulatory Visit: Payer: Medicare Other | Admitting: Nutrition

## 2019-06-23 DIAGNOSIS — Z0000 Encounter for general adult medical examination without abnormal findings: Secondary | ICD-10-CM | POA: Diagnosis not present

## 2019-06-23 DIAGNOSIS — Z1389 Encounter for screening for other disorder: Secondary | ICD-10-CM | POA: Diagnosis not present

## 2019-06-23 DIAGNOSIS — E7849 Other hyperlipidemia: Secondary | ICD-10-CM | POA: Diagnosis not present

## 2019-06-23 DIAGNOSIS — E782 Mixed hyperlipidemia: Secondary | ICD-10-CM | POA: Diagnosis not present

## 2019-06-23 DIAGNOSIS — K149 Disease of tongue, unspecified: Secondary | ICD-10-CM | POA: Diagnosis not present

## 2019-06-23 DIAGNOSIS — M1991 Primary osteoarthritis, unspecified site: Secondary | ICD-10-CM | POA: Diagnosis not present

## 2019-06-23 DIAGNOSIS — Z0001 Encounter for general adult medical examination with abnormal findings: Secondary | ICD-10-CM | POA: Diagnosis not present

## 2019-07-03 DIAGNOSIS — E7849 Other hyperlipidemia: Secondary | ICD-10-CM | POA: Diagnosis not present

## 2019-07-06 ENCOUNTER — Other Ambulatory Visit: Payer: Self-pay

## 2019-07-06 ENCOUNTER — Ambulatory Visit: Payer: Medicare Other | Admitting: Gastroenterology

## 2019-07-06 ENCOUNTER — Other Ambulatory Visit: Payer: Self-pay | Admitting: Gastroenterology

## 2019-07-06 ENCOUNTER — Encounter: Payer: Self-pay | Admitting: Gastroenterology

## 2019-07-06 DIAGNOSIS — R197 Diarrhea, unspecified: Secondary | ICD-10-CM

## 2019-07-06 NOTE — Progress Notes (Signed)
Subjective:    Patient ID: Jocelyn Love, female    DOB: Apr 03, 1946, 74 y.o.   MRN: [id number]   Sharilyn Sites, MD  HPI CHANGE IN BOWEL IN HABITS: LAST NL STOOL LAST WEEK BUT MOSTLY DIARRHEA SINCE LAST YEAR. 4-6 A DAY. HAS RECTAL PAIN/BURNING DUE TO STOOLS. WAS ON ZOLOFT FOR 3 YRS AND THEN CHANGED TO NORTRIPTYLINE AND STILL HAS SORES ON HER TONGUE. ETIOLOGY? STARTED CHOLESTEROL MEDS 3-4 MOS AGO. HELPED WITH CHOLESTEROL.ON GABAPENTIN FOR 1.5 YRS. HELPED LEGS BUT NOT HER TONGUE. NAUSEA WITH MEDS AND OMEPRAZOLE HELPS. NO NOCTURNAL STOOLS. RARE PAIN AT "HERNIA" SITE IN UPPER ABDOMEN AND THINKS IT'S DUE TO WEIGHT GAIN. HAS R RIB PAIN BETTER BUT NOT GONE. TAKING ADVIL AND TYLENOL PRN. IF DOESN'T TAKE OMEPRAZOLE WILL GET FOOD HUNG UP AND HAS INDIGESTION BUT IF TAKES OMEPRAZOLE NO PROBLEMS. TRAVEL: NO, ABX:NO, NO STOOL STUDIES. WELL WATER: NO. NO CONTACTS WITH DIARRHEA. MILK: EVERY DAY(2 CUPS). CHEESE: [id number] WEEKS. ICE CREAM: SOMETIMES EVERY DAY SINCE PAST 6-7 MOS.   PT DENIES FEVER, CHILLS, HEMATOCHEZIA, HEMATEMESIS,  vomiting, melena, CHEST PAIN, SHORTNESS OF BREATH, constipation, problems swallowing, OR problems with sedation.  Past Medical History:  Diagnosis Date  . Anemia   . Chronic nausea   . Depression   . Headache    Past Surgical History:  Procedure Laterality Date  . ABDOMINAL HYSTERECTOMY    . BIOPSY TONGUE     yesterday   . BLADDER SURGERY    . BREAST SURGERY     augmentation  . COLONOSCOPY N/A 11/26/2016   Procedure: COLONOSCOPY;  Surgeon: Danie Binder, MD;  Location: [facility];  Service: Endoscopy;  Laterality: N/A;  10:15am - office advised patient to arrive at 3:00  . ESOPHAGOGASTRODUODENOSCOPY N/A 11/26/2016   Procedure: ESOPHAGOGASTRODUODENOSCOPY (EGD);  Surgeon: Danie Binder, MD;  Location: [facility];  Service: Endoscopy;  Laterality: N/A;  . ESOPHAGOGASTRODUODENOSCOPY N/A 12/23/2016   Procedure: ESOPHAGOGASTRODUODENOSCOPY (EGD);  Surgeon: Danie Binder, MD;  Location: [facility];  Service: Endoscopy;  Laterality: N/A;  2:30pm  . GIVENS CAPSULE STUDY N/A 12/23/2016   Procedure: GIVENS CAPSULE STUDY;  Surgeon: Danie Binder, MD;  Location: [facility];  Service: Endoscopy;  Laterality: N/A;  . SAVORY DILATION N/A 11/26/2016   Procedure: SAVORY DILATION;  Surgeon: Danie Binder, MD;  Location: [facility];  Service: Endoscopy;  Laterality: N/A;    Allergies  Allergen Reactions  . Codeine Rash  . Sulfa Antibiotics Rash    Current Outpatient Medications  Medication Sig    . acetaminophen (TYLENOL) 500 MG tablet Take 1,000 mg by mouth 2 (two) times daily as needed for moderate pain.    . Ascorbic Acid (VITAMIN C) 1000 MG tablet Take 500 mg by mouth daily.     . Cholecalciferol (VITAMIN D3) 2000 units capsule Take 5,000 Units by mouth daily. Takes 5000 mg daily    . ferrous sulfate tablet Take 325 mg by mouth daily.     Marland Kitchen gabapentin 300 MG capsule Take 100 mg by mouth 3 (three) times daily.     . Multiple Vitamin TABS tablet Take 1 tablet by mouth daily.    . nortriptyline 10 MG capsule Take 10 mg by mouth at bedtime.    Marland Kitchen omeprazole 20 MG capsule Take 20 mg by mouth daily.    . Probiotic Product (PROBIOTIC PO) Take by mouth daily.    . psyllium 28 % packet Take 1 packet by mouth  as needed.    . pyridOXINE 100 MG tablet Take 100 mg by mouth daily.    . Quercetin 250 MG TABS Take by mouth daily. Takes 500 mg    . rosuvastatin 10 MG tablet Take 10 mg by mouth daily.    . Zinc 50 MG CAPS Take by mouth daily.    . Glucosamine HCl  Take 2 tablets by mouth daily.    .      .      .       Review of Systems PER HPI OTHERWISE ALL SYSTEMS ARE NEGATIVE.     Objective:   Physical Exam Constitutional:      General: She is not in acute distress.    Appearance: Normal appearance.  HENT:     Mouth/Throat:     Comments: MASK IN PLACE Eyes:     General: No scleral icterus.    Pupils: Pupils are equal, round, and reactive to  light.  Cardiovascular:     Rate and Rhythm: Normal rate and regular rhythm.     Pulses: Normal pulses.     Heart sounds: Normal heart sounds.  Pulmonary:     Effort: Pulmonary effort is normal.     Breath sounds: Normal breath sounds.  Abdominal:     General: Bowel sounds are normal.     Palpations: Abdomen is soft.     Tenderness: There is no abdominal tenderness.  Musculoskeletal:     Cervical back: Normal range of motion.     Right lower leg: No edema.     Left lower leg: No edema.  Lymphadenopathy:     Cervical: No cervical adenopathy.  Skin:    General: Skin is warm and dry.  Neurological:     Mental Status: She is alert and oriented to person, place, and time.     Comments: NO  NEW FOCAL DEFICITS  Psychiatric:        Mood and Affect: Mood normal.     Comments: NORMAL AFFECT       Assessment & Plan:

## 2019-07-06 NOTE — Patient Instructions (Addendum)
DRINK WATER TO KEEP YOUR URINE LIGHT YELLOW.  FOLLOW A LACTOSE FREET DIET FOR 3 WEEKS. SEE INFO BELOW.   CONTINUE YOUR WEIGHT LOSS EFFORTS. I RECOMMEND YOU READ AND FOLLOW RECOMMENDATIONS BY DR. MARK HYMAN, "10-DAY DETOX DIET".   IF YOU MUST CONSUME DAIRY, ADD LACTASE 3 PILLS WITH MEALS UP TO THREE TIMES A DAY.  CONTINUE PROBIOTIC AND METAMUCIL.   PLEASE CALL IN ONE MONTH IF YOUR STILL HAVING PROBLEMS WITH DIARRHEA.  FOLLOW UP IN 4 MOS.     Lactose Free Diet Lactose is a carbohydrate that is found mainly in milk and milk products, as well as in foods with added milk or whey. Lactose must be digested by the enzyme in order to be used by the body. Lactose intolerance occurs when there is a shortage of lactase. When your body is not able to digest lactose, you may feel sick to your stomach (nausea), bloating, cramping, gas and diarrhea.  There are many dairy products that may be tolerated better than milk by some people:  The use of cultured dairy products such as yogurt, buttermilk, cottage cheese, and sweet acidophilus milk (Kefir) for lactase-deficient individuals is usually well tolerated. This is because the healthy bacteria help digest lactose.   Lactose-hydrolyzed milk (Lactaid) contains 40-90% less lactose than milk and may also be well tolerated.    SPECIAL NOTES  Lactose is a carbohydrates. The major food source is dairy products. Reading food labels is important. Many products contain lactose even when they are not made from milk. Look for the following words: whey, milk solids, dry milk solids, nonfat dry milk powder. Typical sources of lactose other than dairy products include breads, candies, cold cuts, prepared and processed foods, and commercial sauces and gravies.   All foods must be prepared without milk, cream, or other dairy foods.   Soy milk and lactose-free supplements (LACTASE) may be used as an alternative to milk.   FOOD GROUP ALLOWED/RECOMMENDED AVOID/USE  SPARINGLY  BREADS / STARCHES 4 servings or more* Breads and rolls made without milk. [place], [place], or [place] bread. Breads and rolls that contain milk. Prepared mixes such as muffins, biscuits, waffles, pancakes. Sweet rolls, donuts, [place] toast (if made with milk or lactose).  Crackers: Soda crackers, graham crackers. Any crackers prepared without lactose. Zwieback crackers, corn curls, or any that contain lactose.  Cereals: Cooked or dry cereals prepared without lactose (read labels). Cooked or dry cereals prepared with lactose (read labels). Total, Cocoa Krispies. Special K.  Potatoes / Pasta / Rice: Any prepared without milk or lactose. Popcorn. Instant potatoes, frozen [place] fries, scalloped or au gratin potatoes.  VEGETABLES 2 servings or more Fresh, frozen, and canned vegetables. Creamed or breaded vegetables. Vegetables in a cheese sauce or with lactose-containing margarines.  FRUIT 2 servings or more All fresh, canned, or frozen fruits that are not processed with lactose. Any canned or frozen fruits processed with lactose.  MEAT & SUBSTITUTES 2 servings or more (4 to 6 oz. total per day) Plain beef, chicken, fish, [place], lamb, veal, pork, or ham. Kosher prepared meat products. Strained or junior meats that do not contain milk. Eggs, soy meat substitutes, nuts. Scrambled eggs, omelets, and souffles that contain milk. Creamed or breaded meat, fish, or fowl. Sausage products such as wieners, liver sausage, or cold cuts that contain milk solids. Cheese, cottage cheese, or cheese spreads.  MILK None. (See "BEVERAGES" for milk substitutes. See "DESSERTS" for ice cream and frozen desserts.) Milk (whole, 2%, skim, or chocolate).  Evaporated, powdered, or condensed milk; malted milk.  SOUPS & COMBINATION FOODS Bouillon, broth, vegetable soups, clear soups, consomms. Homemade soups made with allowed ingredients. Combination or prepared foods that do not contain milk or milk products (read  labels). Cream soups, chowders, commercially prepared soups containing lactose. Macaroni and cheese, pizza. Combination or prepared foods that contain milk or milk products.  DESSERTS & SWEETS In moderation Water and fruit ices; gelatin; angel food cake. Homemade cookies, pies, or cakes made from allowed ingredients. Pudding (if made with water or a milk substitute). Lactose-free tofu desserts. Sugar, honey, corn syrup, jam, jelly; marmalade; molasses (beet sugar); Pure sugar candy; marshmallows. Ice cream, ice milk, sherbet, custard, pudding, frozen yogurt. Commercial cake and cookie mixes. Desserts that contain chocolate. Pie crust made with milk-containing margarine; reduced-calorie desserts made with a sugar substitute that contains lactose. Toffee, peppermint, butterscotch, chocolate, caramels.  FATS & OILS In moderation Butter (as tolerated; contains very small amounts of lactose). Margarines and dressings that do not contain milk, Vegetable oils, shortening, Miracle Whip, mayonnaise, nondairy cream & whipped toppings without lactose or milk solids added (examples: Coffee Rich, Carnation Coffeemate, Rich's Whipped Topping, PolyRich). Berniece Salines. Margarines and salad dressings containing milk; cream, cream cheese; peanut butter with added milk solids, sour cream, chip dips, made with sour cream.  BEVERAGES Carbonated drinks; tea; coffee and freeze-dried coffee; some instant coffees (check labels). Fruit drinks; fruit and vegetable juice; Rice or Soy milk. Ovaltine, hot chocolate. Some cocoas; some instant coffees; instant iced teas; powdered fruit drinks (read labels).   CONDIMENTS / MISCELLANEOUS Soy sauce, carob powder, olives, gravy made with water, baker's cocoa, pickles, pure seasonings and spices, wine, pure monosodium glutamate, catsup, mustard. Some chewing gums, chocolate, some cocoas. Certain antibiotics and vitamin / mineral preparations. Spice blends if they contain milk products. MSG  extender. Artificial sweeteners that contain lactose such as Equal (Nutra-Sweet) and Sweet 'n Low. Some nondairy creamers (read labels).

## 2019-07-06 NOTE — Progress Notes (Signed)
Cc'ed to pcp °

## 2019-07-06 NOTE — Assessment & Plan Note (Signed)
SYMPTOMS NOT IDEALLY CONTROLLED. DIFFERENTIAL DIAGNOSIS INCLUDES: LACTOSE INTOLERANCE, PANCREATIC OR BILE SALT INSUFFICIENCY, MICROSCOPIC COLITIS, LESS LIKELY CELIAC SPRUE, THYROID DISTURBANCE, GIARDIASIS, C DIFF COLITIS, OR IBD.  DRINK WATER TO KEEP YOUR URINE LIGHT YELLOW. FOLLOW A LACTOSE FREET DIET FOR 3 WEEKS.  HANDOUT GIVEN. CONTINUE YOUR WEIGHT LOSS EFFORTS. I RECOMMEND YOU READ AND FOLLOW RECOMMENDATIONS BY DR. MARK HYMAN, "10-DAY DETOX DIET". IF YOU MUST CONSUME DAIRY, ADD LACTASE 3 PILLS WITH MEALS UP TO THREE TIMES A DAY. CONTINUE PROBIOTIC AND METAMUCIL. COMPLETE LABS AND STOOL SAMPLES. PLEASE CALL IN ONE MONTH IF YOUR STILL HAVING PROBLEMS WITH DIARRHEA. FOLLOW UP IN 4 MOS.

## 2019-07-08 ENCOUNTER — Other Ambulatory Visit: Payer: Self-pay | Admitting: Gastroenterology

## 2019-07-08 DIAGNOSIS — R197 Diarrhea, unspecified: Secondary | ICD-10-CM | POA: Diagnosis not present

## 2019-07-08 LAB — TISSUE TRANSGLUTAMINASE, IGA: Transglutaminase IgA: <2 U/mL (ref 0–3)

## 2019-07-08 LAB — TSH: TSH: 1.47 u[IU]/mL (ref 0.450–4.500)

## 2019-07-14 LAB — GIARDIA, EIA; OVA/PARASITE: Giardia Ag, Stl: NEGATIVE

## 2019-07-14 LAB — FECAL LACTOFERRIN, QUANT: Lactoferrin, Fecal, Quant.: <1 ug/mL(g) (ref 0.00–7.24)

## 2019-07-31 DIAGNOSIS — E7849 Other hyperlipidemia: Secondary | ICD-10-CM | POA: Diagnosis not present

## 2019-08-02 DIAGNOSIS — K1230 Oral mucositis (ulcerative), unspecified: Secondary | ICD-10-CM | POA: Diagnosis not present

## 2019-08-02 DIAGNOSIS — K1321 Leukoplakia of oral mucosa, including tongue: Secondary | ICD-10-CM | POA: Diagnosis not present

## 2019-08-22 ENCOUNTER — Telehealth: Payer: Self-pay

## 2019-08-22 NOTE — Telephone Encounter (Signed)
I called pt to discuss her appointment tomorrow. Pt reports that she has sores on her tongue. She reports no change in her symptoms since she was evaluated in Sept of 2019 by Dr. Rexene Alberts. She reports that she had forgotten she had been evaluated here before and would actually prefer to cancel the appt tomorrow. I asked her to call back with further questions or concerns. Pt verbalized understanding.

## 2019-08-23 ENCOUNTER — Ambulatory Visit: Payer: Medicare Other | Admitting: Neurology

## 2019-08-31 DIAGNOSIS — E7849 Other hyperlipidemia: Secondary | ICD-10-CM | POA: Diagnosis not present

## 2019-09-20 DIAGNOSIS — K140 Glossitis: Secondary | ICD-10-CM | POA: Diagnosis not present

## 2019-09-20 DIAGNOSIS — E7849 Other hyperlipidemia: Secondary | ICD-10-CM | POA: Diagnosis not present

## 2019-09-20 DIAGNOSIS — K146 Glossodynia: Secondary | ICD-10-CM | POA: Diagnosis not present

## 2019-09-30 DIAGNOSIS — E7849 Other hyperlipidemia: Secondary | ICD-10-CM | POA: Diagnosis not present

## 2019-10-12 DIAGNOSIS — K1329 Other disturbances of oral epithelium, including tongue: Secondary | ICD-10-CM | POA: Diagnosis not present

## 2019-10-12 DIAGNOSIS — L821 Other seborrheic keratosis: Secondary | ICD-10-CM | POA: Diagnosis not present

## 2019-10-31 DIAGNOSIS — E7849 Other hyperlipidemia: Secondary | ICD-10-CM | POA: Diagnosis not present

## 2019-11-02 ENCOUNTER — Encounter: Payer: Self-pay | Admitting: Gastroenterology

## 2019-11-02 NOTE — Progress Notes (Signed)
Referring Provider: Sharilyn Sites, MD Primary Care Physician:  Sharilyn Sites, MD Primary GI Physician: Dr. Oneida Alar (Dr. Gala Romney following for now)  Chief Complaint  Patient presents with  . Diarrhea    daily, 4-5x  . Nausea    occ, but better not taking some meds    HPI:   Jocelyn Love is a 74 y.o. female presenting today for follow-up of diarrhea and to schedule surveillance colonoscopy.  History of GERD, C. 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  Labs in January 2021 with hemoglobin 12.2 with normocytic indices.  Last seen in our office 07/06/2019.  She reported mostly diarrhea since last year with 4-6 stools a day, rectal pain/burning due to frequent BMs.  No nocturnal stools.  Drinking milk daily.  Rare pain at hernia site and upper abdomen and thinks it is due to weight gain.  Omeprazole helps with nausea that occurred with meds.  Differential for diarrhea was broad including lactose intolerance, pancreatic or bile salt insufficiency, microscopic colitis, less likely celiac sprue, thyroid disturbance, infectious diarrhea, or IBD.  Advised to follow lactose-free diet, use lactase 3 pills with meals up to 3 times daily, follow 10-day detox diet, continue probiotic and Metamucil, update labs and stool studies.  Labs completed with normal thyroid function, TTG IgA within normal limits, negative Giardia test, and fecal lactoferrin within normal limits.  C. difficile testing was not completed.  Today: Taking a probiotics daily which helps diarrhea somewhat. Typically witht 4-5 loose stools a day. Watery diarrhea about once a week. Has rectal burning related to frequent stools.  Occasional blood on  toilet tissue when having watery stools. Reports known hemorrhoids since child birth.  Using Preparation H as needed which does help. BMs have been this way for about 1.5 years. No blood in the stool or black stools. No mucous in the stool. No nocturnal stools. Rare urgency although this does occur and she has had accidents related to this. No abdominal pain prior to a BMs. Diarrhea not specifically after meals.    Tried lactose free diet/lactid prior to dairy and noticed no difference. Has resumed dairy. Occasional fried foods. No worsening diarrhea.    Can't remember if she tried Matamucil.   Had some nausea with taking so many medications. Has stopped taking several supplements and nausea has improved. No GERD symptoms on omeprazole 20 mg daily. No dysphagia.   Taking Advil 3 tablets every 6 hours for her chronic tongue pain. This is the only thing that helps her. Follows with ENT for this and they have tried several different medications without relief. States Advil and tylenol are the only things that help.  Stopped taking iron a few months ago.  No trouble with moderate sedation during EGD/TCS in the past.  Past Medical History:  Diagnosis Date  . Anemia   . Chronic nausea   . Clostridium difficile diarrhea 11/2016   treated with flagyl  . Depression   . Headache     Past Surgical History:  Procedure Laterality Date  . ABDOMINAL HYSTERECTOMY    . BIOPSY TONGUE    . BLADDER SURGERY    . BREAST SURGERY     augmentation  .  COLONOSCOPY N/A 11/26/2016   Procedure: COLONOSCOPY;  Surgeon: Danie Binder, MD;  10 mm cecal polyp removed piecemeal and treated with APC, 4 mm in the sigmoid colon, internal hemorrhoids.  Pathology with tubular adenoma in the cecum and hyperplastic polyp in the sigmoid.  Recommended repeat colonoscopy in 3 years.  . 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  No H. pylori, benign small bowel biopsies,  . ESOPHAGOGASTRODUODENOSCOPY N/A 12/23/2016   Procedure: ESOPHAGOGASTRODUODENOSCOPY (EGD);  Surgeon: Danie Binder, MD;  Moderate Schatzki ring, mild gastritis, moderate postulcer deformity in the duodenal bulb, Givens capsule released into the duodenum.  Marland Kitchen GIVENS CAPSULE STUDY N/A 12/23/2016   Procedure: GIVENS CAPSULE STUDY;  Surgeon: Danie Binder, MD; small bowel AVMs  . SAVORY DILATION N/A 11/26/2016   Procedure: SAVORY DILATION;  Surgeon: Danie Binder, MD;  Location: [facility];  Service: Endoscopy;  Laterality: N/A;    Current Outpatient Medications  Medication Sig Dispense Refill  . acetaminophen (TYLENOL) 500 MG tablet Take 1,000 mg by mouth 2 (two) times daily as needed for moderate pain.    . Ascorbic Acid (VITAMIN C) 1000 MG tablet Take 500 mg by mouth daily.     . Cholecalciferol (VITAMIN D3) 2000 units capsule Take 5,000 Units by mouth daily. Takes 5000 mg daily    . gabapentin (NEURONTIN) 300 MG capsule Take 100 mg by mouth 3 (three) times daily.     Marland Kitchen ibuprofen (ADVIL) 200 MG tablet Take 600 mg by mouth every 6 (six) hours as needed.    Marland Kitchen omeprazole (PRILOSEC) 20 MG capsule Take 20 mg by mouth daily.    . Probiotic Product (PROBIOTIC PO) Take by mouth daily.    . rosuvastatin (CRESTOR) 10 MG tablet Take 10 mg by mouth daily.    . sertraline (ZOLOFT) 50 MG tablet Take 50 mg by mouth daily.    . ferrous sulfate 325 (65 FE) MG tablet Take 1 tablet (325 mg total) by mouth 2 (two) times daily with a meal. (Patient not taking: Reported on 11/03/2019) 60 tablet 0  . psyllium (METAMUCIL SMOOTH TEXTURE) 28 % packet Take 1 packet by mouth as needed.     No current facility-administered medications for this visit.    Allergies as of 11/03/2019 - Review Complete 11/03/2019  Allergen Reaction  Noted  . Codeine Rash 01/20/2016  . Sulfa antibiotics Rash 01/20/2016    Family History  Problem Relation Age of Onset  . Colon cancer Neg Hx     Social History   Socioeconomic History  . Marital status: Widowed    Spouse name: Not on file  . Number of children: Not on file  . Years of education: Not on file  . Highest education level: Not on file  Occupational History  . Not on file  Tobacco Use  . Smoking status: Former Smoker    Types: Cigarettes  . Smokeless tobacco: Never Used  Substance and Sexual Activity  . Alcohol use: Yes    Comment: occ glass of wine  . Drug use: No  . Sexual activity: Not on file  Other Topics Concern  . Not on file  Social History Narrative  . Not on file   Social Determinants of Health   Financial Resource Strain:   . Difficulty of Paying Living  Expenses:   Food Insecurity:   . Worried About Charity fundraiser in the Last Year:   . Arboriculturist in the Last Year:   Transportation Needs:   . Film/video editor (Medical):   Marland Kitchen Lack of Transportation (Non-Medical):   Physical Activity:   . Days of Exercise per Week:   . Minutes of Exercise per Session:   Stress:   . Feeling of Stress :   Social Connections:   . Frequency of Communication with Friends and Family:   . Frequency of Social Gatherings with Friends and Family:   . Attends Religious Services:   . Active Member of Clubs or Organizations:   . Attends Archivist Meetings:   Marland Kitchen Marital Status:     Review of Systems: Gen: Denies fever, chills, lightheadedness, dizziness, presyncope, syncope CV: Denies chest pain, palpitations. Resp: Denies dyspnea at rest, cough GI: See HPI Heme: See HPI  Physical Exam: BP (!) 164/93   Pulse 68   Temp (!) 97.1 F (36.2 C) (Temporal)   Ht 5\' 3"  (1.6 m)   Wt 153 lb 6.4 oz (69.6 kg)   BMI 27.17 kg/m  General:   Alert and oriented. No distress noted. Pleasant and cooperative.  Head:  Normocephalic and  atraumatic. Eyes:  Conjuctiva clear without scleral icterus. Mouth:  Oral mucosa pink and moist. White lesion on right side of tongue. Marland Kitchen Heart:  S1, S2 present without murmurs appreciated. Lungs:  Clear to auscultation bilaterally. No wheezes, rales, or rhonchi. No distress.  Abdomen:  +BS, soft, non-tender and non-distended. No rebound or guarding. No HSM or masses noted. Msk:  Symmetrical without gross deformities. Normal posture. Extremities:  Without edema. Neurologic:  Alert and  oriented x4 Psych: Normal mood and affect.

## 2019-11-03 ENCOUNTER — Other Ambulatory Visit: Payer: Self-pay

## 2019-11-03 ENCOUNTER — Ambulatory Visit: Payer: Medicare Other | Admitting: Gastroenterology

## 2019-11-03 ENCOUNTER — Telehealth: Payer: Self-pay | Admitting: Gastroenterology

## 2019-11-03 ENCOUNTER — Encounter: Payer: Self-pay | Admitting: Gastroenterology

## 2019-11-03 VITALS — BP 164/93 | HR 68 | Temp 97.1°F | Ht 63.0 in | Wt 153.4 lb

## 2019-11-03 DIAGNOSIS — R197 Diarrhea, unspecified: Secondary | ICD-10-CM

## 2019-11-03 DIAGNOSIS — Z86010 Personal history of colonic polyps: Secondary | ICD-10-CM

## 2019-11-03 DIAGNOSIS — K5520 Angiodysplasia of colon without hemorrhage: Secondary | ICD-10-CM | POA: Diagnosis not present

## 2019-11-03 DIAGNOSIS — K649 Unspecified hemorrhoids: Secondary | ICD-10-CM | POA: Insufficient documentation

## 2019-11-03 DIAGNOSIS — Z860101 Personal history of adenomatous and serrated colon polyps: Secondary | ICD-10-CM | POA: Insufficient documentation

## 2019-11-03 DIAGNOSIS — K219 Gastro-esophageal reflux disease without esophagitis: Secondary | ICD-10-CM

## 2019-11-03 DIAGNOSIS — K625 Hemorrhage of anus and rectum: Secondary | ICD-10-CM | POA: Diagnosis not present

## 2019-11-03 NOTE — Assessment & Plan Note (Addendum)
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  She is currently due for surveillance colonoscopy.  Additional lower GI symptoms include loose-watery stools for the last 1.5 years as discussed below, intermittent toilet tissue hematochezia with rectal burning associated with frequent watery stools in the setting of known internal hemorrhoids.  No family history of colon cancer.  Proceed with colonoscopy with Dr. Gala Romney in the near future for colon cancer surveillance, rectal bleeding, and further evaluation of diarrhea. The risks, benefits, and alternatives have been discussed in detail with patient. They have stated understanding and desire to proceed.  She is on gabapentin and Zoloft but has tolerated moderate sedation well in the past and therefore we will proceed with moderate sedation. Follow-up after procedure.

## 2019-11-03 NOTE — Assessment & Plan Note (Addendum)
Well-controlled on omeprazole 20 mg daily.  No alarm symptoms.  Notably, she is taking a significant amount of Advil on a daily basis due to chronic tongue pain that has failed to respond to other medications.   Continue omeprazole 20 mg daily 30 minutes before breakfast. Advised to limit Advil as much as she possibly can and avoid all other NSAIDs.  Use Tylenol as needed.  No more than 3 g daily.   Follow-up after colonoscopy for colon cancer screening, rectal bleeding, and diarrhea as discussed below.  Addendum:  Patient had new onset of odynophagia around the first of July-see telephone note dated 12/13/19.  Does not feel food is getting stuck in her esophagus. Had pain when swallowing.  States, "It felt like a big lump going all the way down" with associated pain.  She will have pain from her throat all the way down to the lower sternum/epigastric area and within a couple of minutes, she will vomit and symptoms resolved. Has occurred 3 times in the last 2 weeks. She is completely asymptomatic between the episodes. Has continued with Advil and tylenol. About 4-6 Advil daily. Still taking omeprazole.   No recent antibiotics. No new medications. No change in the lesion on her tongue. Present for about 5 years. No other white lesions in her mouth.  She has known Schatzki ring on prior EGDs but has not been manipulated in the past.  Query whether this has worsened.  Chronic NSAID use may be playing a role and contributing to possible esophagitis. Doubt candida. Suspect she may very well need EGD with possible dilation.  Discussed this with patient, and she would like to add possible EGD to TCS.  She was advised to continue to monitor symptoms and let us know of any worsening symptoms in the meantime.  Also advised to proceed to the emergency room if something were to get hung in her esophagus and not come up or go down.  Counseled extensively on the importance of limiting Advil. States she will continue to  work on this.   Plan to proceed with TCS +/- EGD +/- dilation if she continues with odynophagia/?dysphagia.

## 2019-11-03 NOTE — Assessment & Plan Note (Addendum)
1.5-year history of loose/watery diarrhea with 4-5 BMs daily.  Daily probiotic has helped with stool consistency typically having loose rather than completely watery stools but does still have watery diarrhea at least once a week.  No nocturnal stools.  No associated abdominal pain. Rare urgency.  No association with meals or certain foods.  Associated intermittent toilet tissue hematochezia with rectal burning with known history of hemorrhoids.  She does have history of C. difficile in 2018 treated with Flagyl with resolution of diarrhea.  Recent evaluation in February 2021 for diarrhea with normal thyroid function, TTG IgA within normal limits, negative Giardia testing, fecal lactoferrin within normal limits.  C. difficile testing was ordered but not completed.  Last colonoscopy in 2018 with large 10 mm tubular adenoma, currently due for repeat colonoscopy.  Symptoms were not classic of infectious diarrhea; however, considering history of C. difficile, we will go ahead and complete C. difficile stool testing.  Additional differentials include microscopic colitis in the setting of significant NSAID use, less likely IBS, IBD, pancreatic insufficiency.    Plan: Complete C. difficile stool testing. Limit use of Advil as much as possible and avoid all other NSAIDs.  Use Tylenol as needed.  No more than 3 g daily. Consider trying a different probiotic as she has noted improvement in stool consistency with a probiotic. Try adding Metamucil to see if this will help bulk up her stool.  May discontinue if this worsens diarrhea. Continue Preparation H as needed for hemorrhoid symptoms. Use Tucks pads when wiping. Proceed with colonoscopy with Dr. Gala Romney in the near future for colon cancer surveillance, rectal bleeding, and further evaluation of diarrhea. The risks, benefits, and alternatives have been discussed in detail with patient. They have stated understanding and desire to proceed.  She is on gabapentin and  Zoloft but has tolerated moderate sedation well in the past and therefore we will proceed with moderate sedation. Follow-up after colonoscopy.

## 2019-11-03 NOTE — Assessment & Plan Note (Addendum)
Intermittent toilet tissue hematochezia in the setting of diarrhea with known internal hemorrhoids.  Associated rectal burning using Preparation H as needed which does help.  She does have history of 10 mm cecal polyp removed piecemeal and treated with APC in 2018 and is currently due for repeat colonoscopy.  I suspect toilet tissue hematochezia is likely secondary from benign anorectal source such as hemorrhoids.  Cannot rule out colon polyps or malignancy.  Proceed with colonoscopy with Dr. Gala Romney in the near future. The risks, benefits, and alternatives have been discussed in detail with patient. They have stated understanding and desire to proceed.  She is on gabapentin and Zoloft but has tolerated moderate sedation well in the past and therefore we will proceed with moderate sedation. Continue using Preparation H as needed for hemorrhoid symptoms. Use Tucks pads when wiping to help with rectal irritation. Further management of diarrhea discussed below. Follow-up after procedure.

## 2019-11-03 NOTE — Telephone Encounter (Signed)
I recommended colonoscopy when I saw patient today.  I advised that she resume iron but forgot to add hold iron x7 days prior to procedure to her colonoscopy instructions.

## 2019-11-03 NOTE — Assessment & Plan Note (Addendum)
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 Dr. Oneida Alar recommended remaining on oral iron indefinitely.  Most recent hemoglobin 12.2 with normocytic indices in January 2021.  Notably, patient stopped iron a couple months ago and is taking 3 Advil every 6 hours for chronic tongue pain.  Denies melena.  She does have intermittent toilet tissue hematochezia in setting of watery diarrhea with known hemorrhoids as discussed below.  She has continued omeprazole 20 mg daily.  Discussed the fact that patient is taking a significant amount of NSAIDs and the fact that ideally, she should not be taking any considering her history.  Unfortunately, due to chronic tongue pain followed by ENT, she notes Advil and Tylenol are the only medications that help.  Advise she limit Advil and all other NSAIDs as much as possible and use Tylenol as needed, no more than 3 g daily. Continue taking omeprazole 20 mg daily 30 minutes before breakfast. Resume oral iron daily.

## 2019-11-03 NOTE — Patient Instructions (Addendum)
We will get you scheduled for colonoscopy in the near future with Dr. Gala Romney.  Please have status stool test completed at  [facility] lab.  Be sure you are submitting a loose, watery stool.  Continue taking a daily probiotic.  You may try a different probiotic if you would like.  Digestive advantage, align, and Hardin Negus colon health are good choices.  You may also try adding Metamucil daily.  This worsening diarrhea, you may discontinue.  Continue using Preparation H as needed for hemorrhoid symptoms.  I recommend you start using Tucks pads when wiping to help soothe rectal irritation.  Also recommend you resume taking iron as Dr. Oneida Alar have recommended you take this for life due to history of iron deficiency anemia.  I understand you are needing Advil to help manage her tongue pain.  I recommend you try to limit this as much as you possibly can and use Tylenol.  Be sure not to take more than 3 g of Tylenol in 24 hours.   Continue taking omeprazole 20 mg daily 30 minutes before breakfast.  We will plan to see you back after your colonoscopy.  Call with questions or concerns prior.  Aliene Altes, PA-C [facility]

## 2019-11-04 NOTE — Telephone Encounter (Signed)
Noted. I made note on encounter form to hold Iron for 7 days prior to TCS.

## 2019-11-30 DIAGNOSIS — E7849 Other hyperlipidemia: Secondary | ICD-10-CM | POA: Diagnosis not present

## 2019-12-02 ENCOUNTER — Telehealth: Payer: Self-pay | Admitting: *Deleted

## 2019-12-02 NOTE — Telephone Encounter (Signed)
Schedule TCS with RMR. Hold Iron 7 days prior   Called pt, received VM, VM not set up

## 2019-12-08 ENCOUNTER — Encounter: Payer: Self-pay | Admitting: Gastroenterology

## 2019-12-13 ENCOUNTER — Telehealth: Payer: Self-pay | Admitting: *Deleted

## 2019-12-13 NOTE — Telephone Encounter (Signed)
Called pt to schedule procedure.  Pt requested to have EGD done with TCS.  Pt informed me that she has developed a new issue since her last ov on 11/03/19.  She says it hurts when she is swallowing food and that it makes her vomit.  I have scheduled her an ov for 02/08/20.  Does she need to keep ov or would you like me to cx it and just schedule for EGD and TCS?

## 2019-12-13 NOTE — Telephone Encounter (Signed)
Spoke with patient.  Does not feel food is getting stuck in her esophagus. Had pain when swallowing.  States it felt like " a big lump going all the way down" with associated pain.  She will have pain from her throat all the way down to the lower sternum/epigastric area and within a couple of minutes, she will vomit and symptoms resolved.  Occurred a couple times 1.5 weeks ago. The worst was last Friday with hash browns. She is completely asymptomatic between the episodes. Has continued with Advil and tylenol. About 4-6 Advil daily. Still taking omeprazole.   No recent antibiotics. No new medications. No change in the lesion on her tongue. Present for about 5 years.   She has known Schatzki ring on prior EGDs but has not been manipulated in the past.  Query whether this has worsened.  Chronic NSAID use may be playing a role and contributing to possible esophagitis.  Suspect she may very well need EGD with possible dilation.  Discussed this with patient, and she would like to add possible EGD to TCS.  She was advised to continue to monitor symptoms and let us know of any worsening symptoms in the meantime.  Also advised to proceed to the emergency room if something were to get hung in her esophagus and not come up or go down.  Counseled extensively on the importance of limiting Advil. States she will continue to work on this.   Angie: Lets go ahead and add on possible EGD +/- dilation to TCS. If she is still having symptoms, Dr. Gala Romney can perform EGD. No need for OV on 02/08/20.

## 2019-12-14 ENCOUNTER — Other Ambulatory Visit: Payer: Self-pay | Admitting: *Deleted

## 2019-12-14 ENCOUNTER — Encounter: Payer: Self-pay | Admitting: *Deleted

## 2019-12-14 MED ORDER — PEG 3350-KCL-NA BICARB-NACL 420 G PO SOLR: 4000.0000 mL | Freq: Once | ORAL | 0 refills | Status: AC

## 2019-12-14 NOTE — Telephone Encounter (Addendum)
Called pt and scheduled procedure for 01/20/20.  Pt is aware to arrive at 8:00 am to register.  Scheduled Covid screening for 01/18/20 at 11:00 am.  Pt is aware that both are done at [place].  Pt requested RX to be sent to [facility] for delivery.  Sent RX and placed note to pharmacy.  Pt is aware that I will mail out prep instructions and Covid screening info.  Pt is aware that I am cancelling her ov for 02/08/20 per [facility]. Pt voiced understanding.

## 2019-12-30 DIAGNOSIS — E7849 Other hyperlipidemia: Secondary | ICD-10-CM | POA: Diagnosis not present

## 2020-01-05 ENCOUNTER — Encounter (HOSPITAL_COMMUNITY)
Admission: RE | Admit: 2020-01-05 | Discharge: 2020-01-05 | Disposition: A | Discharge status: Home / Self Care | Payer: Medicare Other | Source: Ambulatory Visit | Attending: Internal Medicine | Admitting: Internal Medicine

## 2020-01-05 ENCOUNTER — Other Ambulatory Visit: Payer: Self-pay

## 2020-01-18 ENCOUNTER — Other Ambulatory Visit: Payer: Self-pay

## 2020-01-18 ENCOUNTER — Other Ambulatory Visit (HOSPITAL_COMMUNITY)
Admission: RE | Admit: 2020-01-18 | Discharge: 2020-01-18 | Disposition: A | Discharge status: Home / Self Care | Payer: Medicare Other | Source: Ambulatory Visit | Attending: Internal Medicine | Admitting: Internal Medicine

## 2020-01-18 DIAGNOSIS — Z01812 Encounter for preprocedural laboratory examination: Secondary | ICD-10-CM | POA: Diagnosis not present

## 2020-01-18 DIAGNOSIS — Z20822 Contact with and (suspected) exposure to covid-19: Secondary | ICD-10-CM | POA: Insufficient documentation

## 2020-01-18 LAB — SARS CORONAVIRUS 2 (TAT 6-24 HRS): SARS Coronavirus 2: NEGATIVE

## 2020-01-19 ENCOUNTER — Other Ambulatory Visit: Payer: Self-pay

## 2020-01-19 ENCOUNTER — Telehealth: Payer: Self-pay

## 2020-01-19 NOTE — Telephone Encounter (Signed)
Received voicemail from Roslyn at pre-service center. Pt is scheduled for TCS/EGD/DIL tomorrow and needs PA from [facility]. [phone], ext [phone]  PA for EGD/DIL submitted via [facility] website (TCS doesn't require PA from [facility]). Case approved. PA# [id number], valid 01/20/20-04/19/20.   LMOVM to inform [place] of PA.

## 2020-01-20 ENCOUNTER — Other Ambulatory Visit: Payer: Self-pay

## 2020-01-20 ENCOUNTER — Encounter (HOSPITAL_COMMUNITY): Payer: Self-pay | Admitting: *Deleted

## 2020-01-20 ENCOUNTER — Ambulatory Visit (HOSPITAL_COMMUNITY): Payer: Medicare Other | Admitting: Anesthesiology

## 2020-01-20 ENCOUNTER — Encounter (HOSPITAL_COMMUNITY)
Admission: RE | Disposition: A | Discharge status: Home / Self Care | Payer: Self-pay | Source: Home / Self Care | Attending: Internal Medicine

## 2020-01-20 ENCOUNTER — Ambulatory Visit (HOSPITAL_COMMUNITY)
Admission: RE | Admit: 2020-01-20 | Discharge: 2020-01-20 | Disposition: A | Discharge status: Home / Self Care | Payer: Medicare Other | Attending: Internal Medicine | Admitting: Internal Medicine

## 2020-01-20 DIAGNOSIS — R1310 Dysphagia, unspecified: Secondary | ICD-10-CM

## 2020-01-20 DIAGNOSIS — Z885 Allergy status to narcotic agent status: Secondary | ICD-10-CM | POA: Insufficient documentation

## 2020-01-20 DIAGNOSIS — K5730 Diverticulosis of large intestine without perforation or abscess without bleeding: Secondary | ICD-10-CM | POA: Insufficient documentation

## 2020-01-20 DIAGNOSIS — K648 Other hemorrhoids: Secondary | ICD-10-CM | POA: Insufficient documentation

## 2020-01-20 DIAGNOSIS — K319 Disease of stomach and duodenum, unspecified: Secondary | ICD-10-CM | POA: Insufficient documentation

## 2020-01-20 DIAGNOSIS — F329 Major depressive disorder, single episode, unspecified: Secondary | ICD-10-CM | POA: Diagnosis not present

## 2020-01-20 DIAGNOSIS — K649 Unspecified hemorrhoids: Secondary | ICD-10-CM | POA: Diagnosis not present

## 2020-01-20 DIAGNOSIS — R519 Headache, unspecified: Secondary | ICD-10-CM | POA: Insufficient documentation

## 2020-01-20 DIAGNOSIS — R197 Diarrhea, unspecified: Secondary | ICD-10-CM | POA: Diagnosis not present

## 2020-01-20 DIAGNOSIS — Z882 Allergy status to sulfonamides status: Secondary | ICD-10-CM | POA: Diagnosis not present

## 2020-01-20 DIAGNOSIS — K219 Gastro-esophageal reflux disease without esophagitis: Secondary | ICD-10-CM | POA: Diagnosis not present

## 2020-01-20 DIAGNOSIS — K2950 Unspecified chronic gastritis without bleeding: Secondary | ICD-10-CM | POA: Insufficient documentation

## 2020-01-20 DIAGNOSIS — K222 Esophageal obstruction: Secondary | ICD-10-CM | POA: Insufficient documentation

## 2020-01-20 DIAGNOSIS — Z87891 Personal history of nicotine dependence: Secondary | ICD-10-CM | POA: Insufficient documentation

## 2020-01-20 DIAGNOSIS — Z79899 Other long term (current) drug therapy: Secondary | ICD-10-CM | POA: Diagnosis not present

## 2020-01-20 DIAGNOSIS — Z86010 Personal history of colonic polyps: Secondary | ICD-10-CM | POA: Insufficient documentation

## 2020-01-20 DIAGNOSIS — E785 Hyperlipidemia, unspecified: Secondary | ICD-10-CM | POA: Diagnosis not present

## 2020-01-20 DIAGNOSIS — K449 Diaphragmatic hernia without obstruction or gangrene: Secondary | ICD-10-CM | POA: Diagnosis not present

## 2020-01-20 DIAGNOSIS — K625 Hemorrhage of anus and rectum: Secondary | ICD-10-CM | POA: Diagnosis not present

## 2020-01-20 HISTORY — PX: BIOPSY: SHX5522

## 2020-01-20 HISTORY — PX: COLONOSCOPY WITH PROPOFOL: SHX5780

## 2020-01-20 HISTORY — PX: ESOPHAGOGASTRODUODENOSCOPY (EGD) WITH PROPOFOL: SHX5813

## 2020-01-20 SURGERY — COLONOSCOPY WITH PROPOFOL
Anesthesia: General

## 2020-01-20 MED ORDER — CHLORHEXIDINE GLUCONATE CLOTH 2 % EX PADS: 6.0000 | MEDICATED_PAD | Freq: Once | CUTANEOUS | Status: DC

## 2020-01-20 MED ORDER — GLYCOPYRROLATE 0.2 MG/ML IJ SOLN
INTRAMUSCULAR | Status: AC
  Filled 2020-01-20: qty 1

## 2020-01-20 MED ORDER — LACTATED RINGERS IV SOLN
Freq: Once | INTRAVENOUS | Status: AC
  Administered 2020-01-20: 1000 mL via INTRAVENOUS

## 2020-01-20 MED ORDER — LIDOCAINE HCL (CARDIAC) PF 50 MG/5ML IV SOSY
PREFILLED_SYRINGE | INTRAVENOUS | Status: DC | PRN
  Administered 2020-01-20: 80 mg via INTRAVENOUS

## 2020-01-20 MED ORDER — LIDOCAINE VISCOUS HCL 2 % MT SOLN
OROMUCOSAL | Status: AC
  Filled 2020-01-20: qty 15

## 2020-01-20 MED ORDER — PROPOFOL 500 MG/50ML IV EMUL
INTRAVENOUS | Status: DC | PRN
  Administered 2020-01-20: 175 ug/kg/min via INTRAVENOUS
  Administered 2020-01-20: 100 mg via INTRAVENOUS

## 2020-01-20 MED ORDER — LIDOCAINE VISCOUS HCL 2 % MT SOLN
15.0000 mL | Freq: Once | OROMUCOSAL | Status: AC
  Administered 2020-01-20: 15 mL via OROMUCOSAL

## 2020-01-20 MED ORDER — GLYCOPYRROLATE 0.2 MG/ML IJ SOLN
0.2000 mg | Freq: Once | INTRAMUSCULAR | Status: AC
  Administered 2020-01-20: 0.2 mg via INTRAVENOUS

## 2020-01-20 MED ORDER — STERILE WATER FOR IRRIGATION IR SOLN
Status: DC | PRN
  Administered 2020-01-20: 1.5 mL

## 2020-01-20 MED ORDER — LACTATED RINGERS IV SOLN: INTRAVENOUS | Status: DC | PRN

## 2020-01-20 MED ORDER — OMEPRAZOLE 40 MG PO CPDR: 40.0000 mg | DELAYED_RELEASE_CAPSULE | Freq: Two times a day (BID) | ORAL | 5 refills | Status: DC

## 2020-01-20 NOTE — Anesthesia Preprocedure Evaluation (Addendum)
Anesthesia Evaluation  Patient identified by MRN, date of birth, ID band Patient awake    Reviewed: Allergy & Precautions, NPO status , Patient's Chart, lab work & pertinent test results  Airway Mallampati: II  TM Distance: >3 FB Neck ROM: Full    Dental  (+) Dental Advisory Given, Missing, Poor Dentition, Chipped   Pulmonary former smoker,    Pulmonary exam normal breath sounds clear to auscultation       Cardiovascular Exercise Tolerance: Good Normal cardiovascular exam Rhythm:Regular Rate:Normal     Neuro/Psych  Headaches, PSYCHIATRIC DISORDERS Depression    GI/Hepatic GERD  Medicated and Controlled,(+)     substance abuse  alcohol use,   Endo/Other    Renal/GU      Musculoskeletal   Abdominal   Peds  Hematology  (+) anemia ,   Anesthesia Other Findings   Reproductive/Obstetrics                            Anesthesia Physical Anesthesia Plan  ASA: II  Anesthesia Plan: General   Post-op Pain Management:    Induction: Intravenous  PONV Risk Score and Plan: TIVA  Airway Management Planned: Nasal Cannula and Natural Airway  Additional Equipment:   Intra-op Plan:   Post-operative Plan:   Informed Consent: I have reviewed the patients History and Physical, chart, labs and discussed the procedure including the risks, benefits and alternatives for the proposed anesthesia with the patient or authorized representative who has indicated his/her understanding and acceptance.     Dental advisory given  Plan Discussed with: CRNA and Surgeon  Anesthesia Plan Comments:         Anesthesia Quick Evaluation

## 2020-01-20 NOTE — Anesthesia Postprocedure Evaluation (Signed)
Anesthesia Post Note  Patient: Jocelyn Love  Procedure(s) Performed: COLONOSCOPY WITH PROPOFOL (N/A ) ESOPHAGOGASTRODUODENOSCOPY (EGD) WITH PROPOFOL (N/A ) BIOPSY  Patient location during evaluation: Endoscopy Anesthesia Type: General Level of consciousness: oriented, awake, patient cooperative and awake and alert Pain management: pain level controlled Vital Signs Assessment: post-procedure vital signs reviewed and stable Respiratory status: spontaneous breathing, respiratory function stable and nonlabored ventilation Cardiovascular status: blood pressure returned to baseline and stable Postop Assessment: no headache and no backache Anesthetic complications: no   No complications documented.   Last Vitals:  Vitals:   01/20/20 0821 01/20/20 0909  BP: (!) 166/95 135/77  Pulse: 88 75  Resp: 14 (!) 21  Temp: 36.9 C 36.8 C  SpO2: 98% 98%    Last Pain:  Vitals:   01/20/20 0909  TempSrc: Oral  PainSc:                  [name]

## 2020-01-20 NOTE — Transfer of Care (Signed)
Immediate Anesthesia Transfer of Care Note  Patient: Jocelyn Love  Procedure(s) Performed: COLONOSCOPY WITH PROPOFOL (N/A ) ESOPHAGOGASTRODUODENOSCOPY (EGD) WITH PROPOFOL (N/A ) BIOPSY  Patient Location: Endoscopy Unit  Anesthesia Type:General  Level of Consciousness: awake, alert , oriented and patient cooperative  Airway & Oxygen Therapy: Patient Spontanous Breathing  Post-op Assessment: Report given to RN, Post -op Vital signs reviewed and stable and Patient moving all extremities  Post vital signs: Reviewed and stable  Last Vitals:  Vitals Value Taken Time  BP    Temp    Pulse    Resp    SpO2      Last Pain:  Vitals:   01/20/20 0844  TempSrc:   PainSc: 1       Patients Stated Pain Goal: 5 (50/72/25 7505)  Complications: No complications documented.

## 2020-01-20 NOTE — Op Note (Signed)
[facility] Patient Name: Jocelyn Love Procedure Date: 01/20/2020 8:37 AM MRN: [id number] Date of Birth: January 28, 1946 Attending MD: Elon Alas. Abbey Chatters DO CSN: [id number] Age: 74 Admit Type: Outpatient Procedure:                Upper GI endoscopy Indications:              Dysphagia, Suspected esophageal reflux Providers:                Elon Alas. Lacie Landry, DO, Otis Peak B. Sharon Seller, RN,                            Caprice Kluver, Nelma Rothman, Technician Referring MD:              Medicines:                See the Anesthesia note for documentation of the                            administered medications Complications:            No immediate complications. Estimated Blood Loss:     Estimated blood loss was minimal. Procedure:                Pre-Anesthesia Assessment:                           - The anesthesia plan was to use monitored                            anesthesia care (MAC).                           After obtaining informed consent, the endoscope was                            passed under direct vision. Throughout the                            procedure, the patient's blood pressure, pulse, and                            oxygen saturations were monitored continuously. The                            [id number]-H190 ([id number]) scope was introduced through the                            mouth, and advanced to the second part of duodenum.                            The upper GI endoscopy was accomplished without                            difficulty. The patient tolerated the procedure  well. Scope In: 8:46:25 AM Scope Out: 8:51:12 AM Total Procedure Duration: 0 hours 4 minutes 47 seconds  Findings:      A moderate Schatzki ring was found in the lower third of the esophagus.       Obliterated with biopsy forceps.      A small hiatal hernia was present.      Diffuse mild inflammation characterized by erythema was found in the       entire examined stomach.  Biopsies were taken with a cold forceps for       Helicobacter pylori testing.      The duodenal bulb, first portion of the duodenum and second portion of       the duodenum were normal. Biopsies for histology were taken with a cold       forceps for evaluation of celiac disease. Impression:               - Moderate Schatzki ring.                           - Small hiatal hernia.                           - Gastritis. Biopsied.                           - Normal duodenal bulb, first portion of the                            duodenum and second portion of the duodenum.                            Biopsied. Moderate Sedation:      Per Anesthesia Care Recommendation:           - Patient has a contact number available for                            emergencies. The signs and symptoms of potential                            delayed complications were discussed with the                            patient. Return to normal activities tomorrow.                            Written discharge instructions were provided to the                            patient.                           - Resume previous diet.                           - Continue present medications.                           -  Await pathology results.                           - Repeat upper endoscopy PRN for retreatment.                           - Use Prilosec (omeprazole) 40 mg PO BID for 8                            weeks.                           - Return to GI clinic as previously scheduled. Procedure Code(s):        --- Professional ---                           [phone], Esophagogastroduodenoscopy, flexible,                            transoral; with biopsy, single or multiple Diagnosis Code(s):        --- Professional ---                           K22.2, Esophageal obstruction                           K44.9, Diaphragmatic hernia without obstruction or                            gangrene                           K29.70,  Gastritis, unspecified, without bleeding                           R13.10, Dysphagia, unspecified CPT copyright 2019 American Medical Association. All rights reserved. The codes documented in this report are preliminary and upon coder review may  be revised to meet current compliance requirements. Elon Alas. Abbey Chatters, [place] Emmagene Ortner, DO 01/20/2020 8:54:01 AM This report has been signed electronically. Number of Addenda: 0

## 2020-01-20 NOTE — Op Note (Signed)
[facility] Patient Name: Jocelyn Love Procedure Date: 01/20/2020 8:52 AM MRN: [id number] Date of Birth: 04-27-1946 Attending MD: Elon Alas. Abbey Chatters DO CSN: [id number] Age: 74 Admit Type: Outpatient Procedure:                Colonoscopy Indications:              Clinically significant diarrhea of unexplained                            origin Providers:                Elon Alas. Evaluna Utke, DO, Otis Peak B. Sharon Seller, RN,                            Caprice Kluver, Nelma Rothman, Technician Referring MD:              Medicines:                See the Anesthesia note for documentation of the                            administered medications Complications:            No immediate complications. Estimated Blood Loss:     Estimated blood loss was minimal. Procedure:                Pre-Anesthesia Assessment:                           - The anesthesia plan was to use monitored                            anesthesia care (MAC).                           After obtaining informed consent, the colonoscope                            was passed under direct vision. Throughout the                            procedure, the patient's blood pressure, pulse, and                            oxygen saturations were monitored continuously. The                            PCF-H190DL ([id number]) scope was introduced through                            the anus and advanced to the the cecum, identified                            by appendiceal orifice and ileocecal valve. The                            colonoscopy was performed without difficulty. The  patient tolerated the procedure well. The quality                            of the bowel preparation was evaluated using the                            BBPS [facility] Bowel Preparation Scale) with scores                            of: Right Colon = 3 (entire mucosa seen well with                            no residual staining, small fragments of  stool or                            opaque liquid), Transverse Colon = 3 (entire mucosa                            seen well with no residual staining, small                            fragments of stool or opaque liquid) and Left Colon                            = 3 (entire mucosa seen well with no residual                            staining, small fragments of stool or opaque                            liquid). The total BBPS score equals 9. The quality                            of the bowel preparation was excellent. Scope In: 8:54:52 AM Scope Out: 9:05:45 AM Scope Withdrawal Time: 0 hours 8 minutes 38 seconds  Total Procedure Duration: 0 hours 10 minutes 53 seconds  Findings:      The perianal and digital rectal examinations were normal.      Non-bleeding internal hemorrhoids were found during endoscopy.      A few small-mouthed diverticula were found in the sigmoid colon.      Biopsies for histology were taken with a cold forceps from the ascending       colon, transverse colon and descending colon for evaluation of       microscopic colitis.      The terminal ileum appeared normal. Impression:               - Non-bleeding internal hemorrhoids.                           - Diverticulosis in the sigmoid colon.                           - The examined portion of the ileum was normal.                           -  Biopsies were taken with a cold forceps from the                            ascending colon, transverse colon and descending                            colon for evaluation of microscopic colitis. Moderate Sedation:      Per Anesthesia Care Recommendation:           - Patient has a contact number available for                            emergencies. The signs and symptoms of potential                            delayed complications were discussed with the                            patient. Return to normal activities tomorrow.                            Written discharge  instructions were provided to the                            patient.                           - Resume previous diet.                           - Continue present medications.                           - Await pathology results.                           - Repeat colonoscopy in 5 years for surveillance.                           - Return to GI clinic with Aliene Altes in 8                            weeks. Procedure Code(s):        --- Professional ---                           [phone], Colonoscopy, flexible; with biopsy, single                            or multiple Diagnosis Code(s):        --- Professional ---                           K64.8, Other hemorrhoids                           R19.7, Diarrhea, unspecified  K57.30, Diverticulosis of large intestine without                            perforation or abscess without bleeding CPT copyright 2019 American Medical Association. All rights reserved. The codes documented in this report are preliminary and upon coder review may  be revised to meet current compliance requirements. Elon Alas. Abbey Chatters, [place] Abbey Chatters, DO 01/20/2020 9:10:28 AM This report has been signed electronically. Number of Addenda: 0

## 2020-01-20 NOTE — H&P (Signed)
Primary Care Physician:  Sharilyn Sites, MD Primary Gastroenterologist:  Dr. Abbey Chatters  Pre-Procedure History & Physical: HPI:  Jocelyn Love is a 74 y.o. female is here for a surveillance colonoscopy due to history of colon polyps.   Past Medical History:  Diagnosis Date  . Anemia   . Chronic nausea   . Clostridium difficile diarrhea 11/2016   treated with flagyl  . Depression   . Headache   . HLD (hyperlipidemia)     Past Surgical History:  Procedure Laterality Date  . ABDOMINAL HYSTERECTOMY    . BIOPSY TONGUE    . BLADDER SURGERY    . BREAST SURGERY     augmentation  . COLONOSCOPY N/A 11/26/2016   Procedure: COLONOSCOPY;  Surgeon: Danie Binder, MD;  10 mm cecal polyp removed piecemeal and treated with APC, 4 mm in the sigmoid colon, internal hemorrhoids.  Pathology with tubular adenoma in the cecum and hyperplastic polyp in the sigmoid.  Recommended repeat colonoscopy in 3 years.  . 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  No H. pylori, benign small bowel biopsies,  . ESOPHAGOGASTRODUODENOSCOPY N/A 12/23/2016   Procedure: ESOPHAGOGASTRODUODENOSCOPY (EGD);  Surgeon: Danie Binder, MD;  Moderate Schatzki ring, mild gastritis, moderate postulcer deformity in the duodenal bulb, Givens capsule released into the duodenum.  Marland Kitchen GIVENS CAPSULE STUDY N/A 12/23/2016   Procedure: GIVENS CAPSULE STUDY;  Surgeon: Danie Binder, MD; small bowel AVMs  . SAVORY DILATION N/A 11/26/2016   Procedure: SAVORY DILATION;  Surgeon: Danie Binder, MD;  Location: [facility];  Service: Endoscopy;  Laterality: N/A;    Prior to Admission medications   Medication Sig Start Date End Date Taking? Authorizing Provider  acetaminophen (TYLENOL) 500 MG tablet  Take 1,000 mg by mouth 2 (two) times daily as needed for moderate pain.   Yes [provider]  Cholecalciferol (VITAMIN D3) 125 MCG (5000 UT) CAPS Take 5,000 Units by mouth daily. Tak   Yes [provider]  gabapentin (NEURONTIN) 300 MG capsule Take 100 mg by mouth 3 (three) times daily.    Yes [provider]  ibuprofen (ADVIL) 200 MG tablet Take 600 mg by mouth every 6 (six) hours as needed for moderate pain.    Yes [provider]  naproxen sodium (ALEVE) 220 MG tablet Take 440 mg by mouth daily as needed (pain).   Yes [provider]  omeprazole (PRILOSEC) 20 MG capsule Take 20 mg by mouth daily.   Yes [provider]  Probiotic Product (PROBIOTIC PO) Take 1 capsule by mouth daily.    Yes [provider]  rosuvastatin (CRESTOR) 10 MG tablet Take 10 mg by mouth daily.   Yes [provider]  sertraline (ZOLOFT) 50 MG tablet Take 50 mg by mouth daily.    [provider]    Allergies as of 12/14/2019 - Review Complete 11/03/2019  Allergen Reaction Noted  . Codeine Rash 01/20/2016  . Sulfa antibiotics Rash 01/20/2016    Family History  Problem Relation Age of Onset  . Colon cancer Neg Hx     Social History   Socioeconomic History  . Marital status: Widowed    Spouse name: Not on file  . Number of children: Not on file  . Years of education: Not on file  . Highest  education level: Not on file  Occupational History  . Not on file  Tobacco Use  . Smoking status: Former Smoker    Types: Cigarettes  . Smokeless tobacco: Never Used  Vaping Use  . Vaping Use: Never used  Substance and Sexual Activity  . Alcohol use: Yes    Comment: occ glass of wine  . Drug use: No  . Sexual activity: Not on file  Other Topics Concern  . Not on file  Social History Narrative  . Not on file   Social Determinants of Health   Financial Resource Strain:   . Difficulty of Paying Living Expenses: Not on file  Food  Insecurity:   . Worried About Charity fundraiser in the Last Year: Not on file  . Ran Out of Food in the Last Year: Not on file  Transportation Needs:   . Lack of Transportation (Medical): Not on file  . Lack of Transportation (Non-Medical): Not on file  Physical Activity:   . Days of Exercise per Week: Not on file  . Minutes of Exercise per Session: Not on file  Stress:   . Feeling of Stress : Not on file  Social Connections:   . Frequency of Communication with Friends and Family: Not on file  . Frequency of Social Gatherings with Friends and Family: Not on file  . Attends Religious Services: Not on file  . Active Member of Clubs or Organizations: Not on file  . Attends Archivist Meetings: Not on file  . Marital Status: Not on file  Intimate Partner Violence:   . Fear of Current or Ex-Partner: Not on file  . Emotionally Abused: Not on file  . Physically Abused: Not on file  . Sexually Abused: Not on file    Review of Systems: See HPI, otherwise negative ROS  Impression/Plan: Jocelyn Love is here for a colonoscopy to be performed for surveillance purposes  Risks, benefits, limitations, imponderables and alternatives regarding colonoscopy have been reviewed with the patient. Questions have been answered. All parties agreeable.

## 2020-01-20 NOTE — Discharge Instructions (Addendum)
EGD Discharge instructions Please read the instructions outlined below and refer to this sheet in the next few weeks. These discharge instructions provide you with general information on caring for yourself after you leave the hospital. Your doctor may also give you specific instructions. While your treatment has been planned according to the most current medical practices available, unavoidable complications occasionally occur. If you have any problems or questions after discharge, please call your doctor. ACTIVITY  You may resume your regular activity but move at a slower pace for the next 24 hours.   Take frequent rest periods for the next 24 hours.   Walking will help expel (get rid of) the air and reduce the bloated feeling in your abdomen.   No driving for 24 hours (because of the anesthesia (medicine) used during the test).   You may shower.   Do not sign any important legal documents or operate any machinery for 24 hours (because of the anesthesia used during the test).  NUTRITION  Drink plenty of fluids.   You may resume your normal diet.   Begin with a light meal and progress to your normal diet.   Avoid alcoholic beverages for 24 hours or as instructed by your caregiver.  MEDICATIONS  You may resume your normal medications unless your caregiver tells you otherwise.  WHAT YOU CAN EXPECT TODAY  You may experience abdominal discomfort such as a feeling of fullness or "gas" pains.  FOLLOW-UP  Your doctor will discuss the results of your test with you.  SEEK IMMEDIATE MEDICAL ATTENTION IF ANY OF THE FOLLOWING OCCUR:  Excessive nausea (feeling sick to your stomach) and/or vomiting.   Severe abdominal pain and distention (swelling).   Trouble swallowing.   Temperature over 101 F (37.8 C).   Rectal bleeding or vomiting of blood.    Colonoscopy Discharge Instructions  Read the instructions outlined below and refer to this sheet in the next few weeks. These  discharge instructions provide you with general information on caring for yourself after you leave the hospital. Your doctor may also give you specific instructions. While your treatment has been planned according to the most current medical practices available, unavoidable complications occasionally occur.   ACTIVITY  You may resume your regular activity, but move at a slower pace for the next 24 hours.   Take frequent rest periods for the next 24 hours.   Walking will help get rid of the air and reduce the bloated feeling in your belly (abdomen).   No driving for 24 hours (because of the medicine (anesthesia) used during the test).    Do not sign any important legal documents or operate any machinery for 24 hours (because of the anesthesia used during the test).  NUTRITION  Drink plenty of fluids.   You may resume your normal diet as instructed by your doctor.   Begin with a light meal and progress to your normal diet. Heavy or fried foods are harder to digest and may make you feel sick to your stomach (nauseated).   Avoid alcoholic beverages for 24 hours or as instructed.  MEDICATIONS  You may resume your normal medications unless your doctor tells you otherwise.  WHAT YOU CAN EXPECT TODAY  Some feelings of bloating in the abdomen.   Passage of more gas than usual.   Spotting of blood in your stool or on the toilet paper.  IF YOU HAD POLYPS REMOVED DURING THE COLONOSCOPY:  No aspirin products for 7 days or as instructed.  No alcohol for 7 days or as instructed.   Eat a soft diet for the next 24 hours.  FINDING OUT THE RESULTS OF YOUR TEST Not all test results are available during your visit. If your test results are not back during the visit, make an appointment with your caregiver to find out the results. Do not assume everything is normal if you have not heard from your caregiver or the medical facility. It is important for you to follow up on all of your test results.    SEEK IMMEDIATE MEDICAL ATTENTION IF:  You have more than a spotting of blood in your stool.   Your belly is swollen (abdominal distention).   You are nauseated or vomiting.   You have a temperature over 101.   You have abdominal pain or discomfort that is severe or gets worse throughout the day.     Your EGD showed a stricture in the lower esophagus called a Schatzki's ring which I obliterated with forceps.  You also have a small hiatal hernia that looked okay.  You have inflammation in your stomach as well which I biopsied to rule out a bacterial infection called H. pylori.  I also biopsied your small bowel to rule out celiac sprue due to ongoing diarrhea.  I recommend you increase your omeprazole to 40 mg twice daily for the next 8 weeks.  I sent a prescription to your pharmacy.  We may need to repeat the upper endoscopy in the future if your swallowing does not improve.  Your colonoscopy was relatively unremarkable besides hemorrhoids and a few small diverticuli.  No polyps seen.  Given history of polyps, I would recommend we repeat this in 5 years.  I also took biopsies of your colon to rule out microscopic colitis.  My office will contact you next week with all of your pathology  Follow-up with Aliene Altes as previously scheduled.  I hope you have a great rest of your week!

## 2020-01-23 ENCOUNTER — Other Ambulatory Visit: Payer: Self-pay

## 2020-01-23 LAB — SURGICAL PATHOLOGY

## 2020-01-24 ENCOUNTER — Encounter (HOSPITAL_COMMUNITY): Payer: Self-pay | Admitting: Internal Medicine

## 2020-01-31 DIAGNOSIS — E7849 Other hyperlipidemia: Secondary | ICD-10-CM | POA: Diagnosis not present

## 2020-02-08 ENCOUNTER — Ambulatory Visit: Payer: Medicare Other | Admitting: Gastroenterology

## 2020-02-10 ENCOUNTER — Other Ambulatory Visit: Payer: Self-pay | Admitting: *Deleted

## 2020-02-10 MED ORDER — SUCRALFATE 1 G PO TABS: 1.0000 g | ORAL_TABLET | Freq: Four times a day (QID) | ORAL | 0 refills | Status: DC

## 2020-03-01 DIAGNOSIS — E7849 Other hyperlipidemia: Secondary | ICD-10-CM | POA: Diagnosis not present

## 2020-03-11 NOTE — Progress Notes (Signed)
Referring Provider: Sharilyn Sites, MD Primary Care Physician:  Sharilyn Sites, MD Primary GI Physician: Dr. Abbey Chatters  Chief Complaint  Patient presents with  . Hernia    reports sticking out, tenderness, eating makes worse/uncomfortable    HPI:   Jocelyn Love is a 74 y.o. female presenting today for follow-up of GERD, odynophagia, diarrhea, and rectal bleeding s/p EGD and colonoscopy.  History of GERD, C. 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  Labs in January 2021 with hemoglobin 12.2 with normocytic indices.  Prior evaluation for diarrhea in February 2021 with normal thyroid function, TTG IgA within normal limits, Giardia testing negative, fecal lactoferrin within normal limits.  Last seen in our office 11/03/2019: Probiotics helping with diarrhea somewhat.  Typically with 4-5 loose stools daily, watery stools about once a week.  BMs have been this way for about 1.5 years.  Rectal burning related to frequent stools and occasional toilet tissue hematochezia.  No abdominal pain prior to BMs.  Lactose-free diet had not helped diarrhea.  GERD well controlled on omeprazole 20 mg daily.  Advil 3 tablets every 6 hours for chronic tongue pain for which she was following with ENT.  She had stopped taking iron a few months prior. Plan for C. difficile testing, limit Advil, consider trying different probiotic, add Metamucil to bulk up stool, and proceed with colonoscopy.  She is also advised to continue omeprazole daily and resume iron daily.  Patient called 12/13/2019 reporting new onset of odynophagia around beginning of July but denied dysphagia.  Noted when swallowing, she did have pain from her throat down to her lower sternum and then a couple of minutes  would vomit and symptoms would resolve.  Possible EGD was added onto colonoscopy.  Stool testing not completed.  Procedures 01/20/2020: Colonoscopy: Nonbleeding internal hemorrhoids, few small diverticula in sigmoid colon, normal examined ileum, random colon biopsies taken (benign).  Recommended repeat colonoscopy in 5 years. EGD: Moderate Schatzki's ring in the lower third of the esophagus s/p dilation, small hiatal hernia, diffuse inflammation in the entire stomach s/p biopsy, normal examined duodenum s/p biopsy.  Recommended increasing omeprazole to 40 mg twice daily x8 weeks then could decrease back to once daily. Gastric biopsies with gastritis but negative for H. pylori, duodenal biopsies benign.  Patient reported she is still having pain when swallowing certain foods and occasional vomiting.  Dr. Abbey Chatters recommended Carafate 1 g 4 times daily.  Today:  GERD: Taking omeprazole 40 mg daily. No GERD symptoms. No nausea or vomiting.    Odynophagia/dysphagia: Resolved.   Diarrhea: Resolved. Hasn't changed anything. No fiber supplement. She did stop taking probiotic. Having normal BMs 1-2 daily. No blood in the stool. No black stools. With green iron tablet, stools are dark.  When taking a white iron tablet, stools are not dark.  Hernia above her navel. Started out very small years ago. Was standing for a while a few days ago and she started to have pain. Wrapped an ace bandage around her abdomen which helped her feel better. Tender and hard at times. Most of the time she can push it in. On Saturday, she couldn't push it in. Still tender to the touch.   Continues to take Advil and Tylenol for tongue pain.  Past Medical History:  Diagnosis Date  . Anemia   . Chronic nausea   . Clostridium difficile diarrhea 11/2016   treated with flagyl  . Depression   . Headache   . HLD (hyperlipidemia)     Past Surgical History:  Procedure Laterality Date  . ABDOMINAL HYSTERECTOMY    . BIOPSY   01/20/2020   Procedure: BIOPSY;  Surgeon: Eloise Harman, DO;  [facility];  Service: Endoscopy;;  duodenal, antrum body  . BIOPSY TONGUE    . BLADDER SURGERY    . BREAST SURGERY     augmentation  . COLONOSCOPY N/A 11/26/2016   Procedure: COLONOSCOPY;  Surgeon: Danie Binder, MD;  10 mm cecal polyp removed piecemeal and treated with APC, 4 mm in the sigmoid colon, internal hemorrhoids.  Pathology with tubular adenoma in the cecum and hyperplastic polyp in the sigmoid.  Recommended repeat colonoscopy in 3 years.  . COLONOSCOPY WITH PROPOFOL N/A 01/20/2020   Procedure: COLONOSCOPY WITH PROPOFOL;  Surgeon: Eloise Harman, DO; Nonbleeding internal hemorrhoids, few small diverticula in sigmoid colon, normal examined ileum, random colon biopsies taken (benign).  Recommended repeat colonoscopy in 5 years.  . 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  No H. pylori, benign small bowel biopsies,  . ESOPHAGOGASTRODUODENOSCOPY N/A 12/23/2016   Procedure: ESOPHAGOGASTRODUODENOSCOPY (EGD);  Surgeon: Danie Binder, MD;  Moderate Schatzki ring, mild gastritis, moderate postulcer deformity in the duodenal bulb, Givens capsule released into the duodenum.  . ESOPHAGOGASTRODUODENOSCOPY (EGD) WITH PROPOFOL N/A 01/20/2020   Procedure: ESOPHAGOGASTRODUODENOSCOPY (EGD) WITH PROPOFOL;  Surgeon: Eloise Harman, DO; moderate Schatzki's ring s/p dilation, small hiatal hernia, diffuse inflammation in the entire stomach (gastritis, no H. pylori), normal examined duodenum s/p biopsy (benign).  Marland Kitchen GIVENS CAPSULE STUDY N/A 12/23/2016   Procedure: GIVENS CAPSULE STUDY;  Surgeon: Danie Binder, MD; small bowel AVMs  . SAVORY DILATION N/A 11/26/2016   Procedure: SAVORY  DILATION;  Surgeon: Danie Binder, MD;  [facility]: [facility];  Service: Endoscopy;  Laterality: N/A;    Current Outpatient Medications  Medication Sig Dispense Refill  . acetaminophen (TYLENOL) 500 MG tablet Take 1,000 mg by mouth 2 (two) times daily as needed for moderate pain.    . Cholecalciferol (VITAMIN D3) 125 MCG (5000 UT) CAPS Take 5,000 Units by mouth daily. Tak    . gabapentin (NEURONTIN) 300 MG capsule 600mg  in morning and 300mg  at night    . ibuprofen (ADVIL) 200 MG tablet Take 600 mg by mouth every 6 (six) hours as needed for moderate pain.     Marland Kitchen omeprazole (PRILOSEC) 40 MG capsule Take 1 capsule (40 mg total) by mouth 2 (two) times daily before a meal. (Patient taking differently: Take 40 mg by mouth daily. ) 60 capsule 5  . OVER THE COUNTER MEDICATION Iron daily    . rosuvastatin (CRESTOR) 10 MG tablet Take 10 mg by mouth daily.    . sertraline (ZOLOFT) 50 MG tablet Take 50 mg by mouth daily.     No current facility-administered medications for this visit.    Allergies as of 03/12/2020 - Review Complete 03/12/2020  Allergen Reaction Noted  . Codeine Rash 01/20/2016  . Sulfa antibiotics Rash 01/20/2016    Family History  Problem Relation Age of Onset  . Colon cancer Neg  Hx     Social History   Socioeconomic History  . Marital status: Widowed    Spouse name: Not on file  . Number of children: Not on file  . Years of education: Not on file  . Highest education level: Not on file  Occupational History  . Not on file  Tobacco Use  . Smoking status: Former Smoker    Types: Cigarettes  . Smokeless tobacco: Never Used  Vaping Use  . Vaping Use: Never used  Substance and Sexual Activity  . Alcohol use: Yes    Comment: occ glass of wine  . Drug use: No  . Sexual activity: Not on file  Other Topics Concern  . Not on file  Social History Narrative  . Not on file   Social Determinants of Health   Financial Resource Strain:   . Difficulty of Paying  Living Expenses: Not on file  Food Insecurity:   . Worried About Charity fundraiser in the Last Year: Not on file  . Ran Out of Food in the Last Year: Not on file  Transportation Needs:   . Lack of Transportation (Medical): Not on file  . Lack of Transportation (Non-Medical): Not on file  Physical Activity:   . Days of Exercise per Week: Not on file  . Minutes of Exercise per Session: Not on file  Stress:   . Feeling of Stress : Not on file  Social Connections:   . Frequency of Communication with Friends and Family: Not on file  . Frequency of Social Gatherings with Friends and Family: Not on file  . Attends Religious Services: Not on file  . Active Member of Clubs or Organizations: Not on file  . Attends Archivist Meetings: Not on file  . Marital Status: Not on file    Review of Systems: Gen: Denies fever, chills, cold or flulike symptoms, lightheadedness, dizziness, presyncope, syncope.  CV: Denies chest pain or palpitations. Resp: Denies dyspnea or cough. GI: See HPI. Heme: See HPI  Physical Exam: BP (!) 167/87   Pulse 71   Temp (!) 97.1 F (36.2 C) (Oral)   Ht 5\' 3"  (1.6 m)   Wt 148 lb (67.1 kg)   BMI 26.22 kg/m  General:   Alert and oriented. No distress noted. Pleasant and cooperative.  Head:  Normocephalic and atraumatic. Eyes:  Conjuctiva clear without scleral icterus. Heart:  S1, S2 present without murmurs appreciated. Lungs:  Clear to auscultation bilaterally. No wheezes, rales, or rhonchi. No distress.  Abdomen:  +BS, soft, and non-distended.  Small supraumbilical hernia that is easily reducible.  Mild tenderness while reducing.  No rebound or guarding. No HSM noted. Msk:  Symmetrical without gross deformities. Normal posture. Extremities:  Without edema. Neurologic:  Alert and  oriented x4 Psych:  Normal mood and affect.

## 2020-03-12 ENCOUNTER — Ambulatory Visit: Payer: Medicare Other | Admitting: Gastroenterology

## 2020-03-12 ENCOUNTER — Encounter: Payer: Self-pay | Admitting: Gastroenterology

## 2020-03-12 ENCOUNTER — Other Ambulatory Visit: Payer: Self-pay

## 2020-03-12 VITALS — BP 167/87 | HR 71 | Temp 97.1°F | Ht 63.0 in | Wt 148.0 lb

## 2020-03-12 DIAGNOSIS — R197 Diarrhea, unspecified: Secondary | ICD-10-CM | POA: Diagnosis not present

## 2020-03-12 DIAGNOSIS — K439 Ventral hernia without obstruction or gangrene: Secondary | ICD-10-CM | POA: Diagnosis not present

## 2020-03-12 DIAGNOSIS — R1310 Dysphagia, unspecified: Secondary | ICD-10-CM | POA: Insufficient documentation

## 2020-03-12 DIAGNOSIS — K219 Gastro-esophageal reflux disease without esophagitis: Secondary | ICD-10-CM | POA: Diagnosis not present

## 2020-03-12 NOTE — Assessment & Plan Note (Signed)
Odynophagia/dysphagia resolved s/p EGD 01/20/2020 revealing moderate Schatzki's ring in the lower third of the esophagus s/p dilation.  She will continue to monitor for return of symptoms.

## 2020-03-12 NOTE — Assessment & Plan Note (Signed)
Patient had reported 1.5-year history of loose/watery BMs daily.  Symptoms have now self resolved.  Patient denies making any changes.  She did discontinue a probiotic and has also resumed oral iron daily. Stools can be dark on iron. No brbpr.  Prior evaluation with TSH normal, TTG IgA within normal limits, negative Giardia testing, fecal lactoferrin within normal limits, colonoscopy 01/20/2020 with random colon biopsies benign, due for repeat colonoscopy in 5 years.  Advised to monitor for return of symptoms and follow-up in 6 months.

## 2020-03-12 NOTE — Patient Instructions (Addendum)
Discussed with your daughter whether or not you would like to have a referral to [place] [facility] to discuss possible corrective surgery for your hernia.  If you decide you would like referral, please let us know.  Continue taking omeprazole 40 mg daily 30 minutes before breakfast.  Continue taking oral iron daily.   I am glad you are feeling much better overall!  It was great seeing you today!  We will plan to see back in 6 months.  Do not hesitate to call with questions or concerns prior.  Aliene Altes, PA-C [facility]

## 2020-03-12 NOTE — Assessment & Plan Note (Signed)
Small reducible ventral hernia just above the umbilicus.  Patient reports that the hernia will protrude out and cause discomfort at times.  Can also be tender and hard at times.  Last Saturday, she was unable to reduce the hernia as she normally does. Today, I am able to reduce the hernia easily. She noted mild tenderness while hernia was reduced but no pain thereafter. We discussed referral to general surgery. She would like to discuss this with her daughter first. If referral is placed, she prefers [place].

## 2020-03-12 NOTE — Assessment & Plan Note (Signed)
Well-controlled on omeprazole 40 mg daily.  Recent EGD 01/20/2020 with moderate Schatzki's ring s/p dilation, small hiatal hernia, diffuse inflammation in the entire stomach s/p biopsy, normal examined duodenum s/p biopsy.  Gastric biopsies with gastritis, negative H. pylori, duodenal biopsies benign.  Of note, patient had also been experiencing dysphagia/odynophagia prior to EGD.  She increased omeprazole to 40 mg twice daily temporarily and symptoms have resolved.  She has now resumed omeprazole 40 mg daily and is doing well.  She does continue taking Advil and Tylenol due to chronic tongue pain that has failed to respond to other medications.  She is following with ENT.   Advise she continue her current medications and follow-up in 6 months.

## 2020-03-13 DIAGNOSIS — I10 Essential (primary) hypertension: Secondary | ICD-10-CM | POA: Diagnosis not present

## 2020-03-26 DIAGNOSIS — K219 Gastro-esophageal reflux disease without esophagitis: Secondary | ICD-10-CM | POA: Diagnosis not present

## 2020-03-26 DIAGNOSIS — M1991 Primary osteoarthritis, unspecified site: Secondary | ICD-10-CM | POA: Diagnosis not present

## 2020-03-26 DIAGNOSIS — I10 Essential (primary) hypertension: Secondary | ICD-10-CM | POA: Diagnosis not present

## 2020-03-26 DIAGNOSIS — Z79899 Other long term (current) drug therapy: Secondary | ICD-10-CM | POA: Diagnosis not present

## 2020-03-31 DIAGNOSIS — E7849 Other hyperlipidemia: Secondary | ICD-10-CM | POA: Diagnosis not present

## 2020-04-12 DIAGNOSIS — H43813 Vitreous degeneration, bilateral: Secondary | ICD-10-CM | POA: Diagnosis not present

## 2020-05-01 DIAGNOSIS — E7849 Other hyperlipidemia: Secondary | ICD-10-CM | POA: Diagnosis not present

## 2020-05-21 DIAGNOSIS — L820 Inflamed seborrheic keratosis: Secondary | ICD-10-CM | POA: Diagnosis not present

## 2020-05-31 ENCOUNTER — Other Ambulatory Visit (HOSPITAL_COMMUNITY): Payer: Self-pay | Admitting: Physician Assistant

## 2020-05-31 DIAGNOSIS — E2839 Other primary ovarian failure: Secondary | ICD-10-CM

## 2020-06-01 DIAGNOSIS — E7849 Other hyperlipidemia: Secondary | ICD-10-CM | POA: Diagnosis not present

## 2020-06-30 DIAGNOSIS — E7849 Other hyperlipidemia: Secondary | ICD-10-CM | POA: Diagnosis not present

## 2020-07-03 DIAGNOSIS — Z0000 Encounter for general adult medical examination without abnormal findings: Secondary | ICD-10-CM | POA: Diagnosis not present

## 2020-07-03 DIAGNOSIS — Z1389 Encounter for screening for other disorder: Secondary | ICD-10-CM | POA: Diagnosis not present

## 2020-07-03 DIAGNOSIS — E7849 Other hyperlipidemia: Secondary | ICD-10-CM | POA: Diagnosis not present

## 2020-07-03 DIAGNOSIS — E782 Mixed hyperlipidemia: Secondary | ICD-10-CM | POA: Diagnosis not present

## 2020-07-03 DIAGNOSIS — G521 Disorders of glossopharyngeal nerve: Secondary | ICD-10-CM | POA: Diagnosis not present

## 2020-07-30 DIAGNOSIS — E7849 Other hyperlipidemia: Secondary | ICD-10-CM | POA: Diagnosis not present

## 2020-08-29 DIAGNOSIS — E7849 Other hyperlipidemia: Secondary | ICD-10-CM | POA: Diagnosis not present

## 2020-09-08 NOTE — Progress Notes (Signed)
Referring Provider: Sharilyn Sites, MD Primary Care Physician:  Sharilyn Sites, MD Primary GI Physician: Dr. Abbey Chatters  Chief Complaint  Patient presents with  . Hernia    F/u  . Diarrhea    "some days" will have BM 3-4 times a day    HPI:   Jocelyn Love is a 75 y.o. female with history of GERD, odynophagia, diarrhea, rectal bleeding, C. 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 Prior evaluation for diarrhea in February 2021 with normal thyroid function, TTG IgA within normal limits, Giardia testing negative, fecal lactoferrin within normal limits.  Trial of lactose-free diet not helpful.  Ultimately underwent colonoscopy in August 2021 revealing nonbleeding internal hemorrhoids, few small diverticula in the sigmoid colon, normal examined ileum, random colon biopsies benign.  Recommended repeat in 5 years.  Also with EGD in August 2021 due to reported odynophagia revealing moderate Schatzki's ring s/p dilation, small hiatal hernia, diffuse inflammation in the entire stomach s/p biopsied (gastritis with negative H. pylori), normal examined duodenum biopsied (benign).  Omeprazole was increased to 40 mg twice daily at that time.  Carafate 4 times daily was also added.  Last seen in our office 03/12/2020.  She was taking omeprazole 40 mg daily and GERD was well controlled.  Odynophagia/dysphagia had resolved.  Denied nausea or vomiting.  Diarrhea had also resolved.  She denied change in her diet.  She did not add a fiber supplement as previously recommended.  She did stop taking her probiotic.  She was having 1 or 2 bowel movements daily without alarm symptoms.  She also reported a hernia above her navel that have been present for a few years.  She had developed pain a few days ago after standing  for a while.  Symptoms improved with wrapping an Ace bandage around her abdomen.  She reported it was tender and hard at times.  On exam, she has a small reducible ventral hernia just above the umbilicus.  We discussed a referral to general surgery, but she preferred to discuss with her daughter first.  If referral placed, she preferred [place].  She was advised to continue omeprazole 40 mg daily, monitor for return of diarrhea, and follow-up in 6 months.  We did not hear anything back from patient regarding referral to general surgery.  Today:  GERD: Well controlled on omeprazole 40 mg daily. When eating rice, it occasionally gets hung. Feels liquids want to go down the wrong way at times. This happens about once a day. Has to cough to clear her airway.  Can occur with small pieces of food as well.  Thinks she may accidentally inhale or may not be paying close attention while eating or drinking. Wants to monitor for now.   Ventral hernia: Occasional discomfort in this area if eating a larger meal.  States the hernia will protrude out and become a little hard.  She is able to reduce it without trouble.  Still wants to hold off on surgical referral.    Diarrhea: Occasional diarrhea. Stools are typically on the looser side. Usually with 3-4 BMs daily. No nocturnal BMs. No abdominal pain. No constipation. Occasional urgency, but nothing routine. No brbpr or melena.  Had diarrhea before starting magnesium. Will take imodium if needed. Increased frequency of watery stools every 2-3 weeks. No  identified triggers. Hot flashes every now and then, but nothing routine and does not relate this to flares of diarrhea.  IDA: Last labs on file January 2021 with hemoglobin 12.2. No longer taking iron.  No overt bleeding.  Past Medical History:  Diagnosis Date  . Anemia   . Chronic nausea   . Clostridium difficile diarrhea 11/2016   treated with flagyl  . Depression   . Headache   . HLD (hyperlipidemia)      Past Surgical History:  Procedure Laterality Date  . ABDOMINAL HYSTERECTOMY    . BIOPSY  01/20/2020   Procedure: BIOPSY;  Surgeon: Eloise Harman, DO;  [facility];  Service: Endoscopy;;  duodenal, antrum body  . BIOPSY TONGUE    . BLADDER SURGERY    . BREAST SURGERY     augmentation  . COLONOSCOPY N/A 11/26/2016   Procedure: COLONOSCOPY;  Surgeon: Danie Binder, MD;  10 mm cecal polyp removed piecemeal and treated with APC, 4 mm in the sigmoid colon, internal hemorrhoids.  Pathology with tubular adenoma in the cecum and hyperplastic polyp in the sigmoid.  Recommended repeat colonoscopy in 3 years.  . COLONOSCOPY WITH PROPOFOL N/A 01/20/2020   Procedure: COLONOSCOPY WITH PROPOFOL;  Surgeon: Eloise Harman, DO; Nonbleeding internal hemorrhoids, few small diverticula in sigmoid colon, normal examined ileum, random colon biopsies taken (benign).  Recommended repeat colonoscopy in 5 years.  . 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  No H. pylori, benign small bowel biopsies,  . ESOPHAGOGASTRODUODENOSCOPY N/A 12/23/2016   Procedure: ESOPHAGOGASTRODUODENOSCOPY (EGD);  Surgeon: Danie Binder, MD;  Moderate Schatzki ring, mild gastritis, moderate postulcer deformity in the duodenal bulb, Givens capsule released into the duodenum.  . ESOPHAGOGASTRODUODENOSCOPY (EGD) WITH PROPOFOL N/A 01/20/2020   Procedure: ESOPHAGOGASTRODUODENOSCOPY (EGD) WITH PROPOFOL;  Surgeon: Eloise Harman, DO; moderate Schatzki's ring s/p dilation, small hiatal hernia, diffuse inflammation in the entire stomach (gastritis, no H. pylori), normal examined duodenum s/p biopsy (benign).  Marland Kitchen GIVENS CAPSULE STUDY N/A 12/23/2016   Procedure: GIVENS CAPSULE STUDY;  Surgeon:  Danie Binder, MD; small bowel AVMs  . SAVORY DILATION N/A 11/26/2016   Procedure: SAVORY DILATION;  Surgeon: Danie Binder, MD;  [facility];  Service: Endoscopy;  Laterality: N/A;    Current Outpatient Medications  Medication Sig Dispense Refill  . acetaminophen (TYLENOL) 500 MG tablet Take 1,000 mg by mouth 2 (two) times daily as needed for moderate pain.    Marland Kitchen amLODipine (NORVASC) 5 MG tablet Take 5 mg by mouth daily.    . Cholecalciferol (VITAMIN D3) 125 MCG (5000 UT) CAPS Take 5,000 Units by mouth daily. Tak    . Cyanocobalamin (B-12 PO) Take by mouth daily.    . DULoxetine (CYMBALTA) 30 MG capsule Take 30 mg by mouth daily.    Marland Kitchen gabapentin (NEURONTIN) 300 MG capsule Take 300 mg by mouth 3 (three) times daily. 600mg  in morning and 300mg  at night    . ibuprofen (ADVIL) 200 MG tablet Take 600 mg by mouth every 6 (six) hours as needed for moderate pain.     . magnesium oxide (MAG-OX) 400 MG tablet Take 400 mg by mouth daily.    . Omega-3 Fatty Acids (FISH OIL PO) Take by mouth daily.    Marland Kitchen omeprazole (PRILOSEC) 40 MG  capsule Take 1 capsule (40 mg total) by mouth 2 (two) times daily before a meal. (Patient taking differently: Take 40 mg by mouth daily.) 60 capsule 5  . rosuvastatin (CRESTOR) 10 MG tablet Take 10 mg by mouth daily.    Marland Kitchen OVER THE COUNTER MEDICATION Iron daily (Patient not taking: Reported on 09/10/2020)     No current facility-administered medications for this visit.    Allergies as of 09/10/2020 - Review Complete 09/10/2020  Allergen Reaction Noted  . Codeine Rash 01/20/2016  . Sulfa antibiotics Rash 01/20/2016    Family History  Problem Relation Age of Onset  . Colon cancer Neg Hx     Social History   Socioeconomic History  . Marital status: Widowed    Spouse name: Not on file  . Number of children: Not on file  . Years of education: Not on file  . Highest education level: Not on file  Occupational History  . Not on file  Tobacco Use  .  Smoking status: Former Smoker    Types: Cigarettes  . Smokeless tobacco: Never Used  Vaping Use  . Vaping Use: Never used  Substance and Sexual Activity  . Alcohol use: Yes    Comment: occ glass of wine  . Drug use: No  . Sexual activity: Not on file  Other Topics Concern  . Not on file  Social History Narrative  . Not on file   Social Determinants of Health   Financial Resource Strain: Not on file  Food Insecurity: Not on file  Transportation Needs: Not on file  Physical Activity: Not on file  Stress: Not on file  Social Connections: Not on file    Review of Systems: Gen: Denies fever, chills, cold or flulike symptoms, lightheadedness, dizziness, presyncope, syncope. CV: Denies chest pain or palpitations. Resp: Denies dyspnea or cough. GI: See HPI Heme: See HPI  Physical Exam: BP (!) 160/98   Pulse 75   Temp (!) 97.3 F (36.3 C)   Ht 5\' 3"  (1.6 m)   Wt 155 lb 3.2 oz (70.4 kg)   BMI 27.49 kg/m  General:   Alert and oriented. No distress noted. Pleasant and cooperative.  Head:  Normocephalic and atraumatic. Eyes:  Conjuctiva clear without scleral icterus. Heart:  S1, S2 present without murmurs appreciated. Lungs:  Clear to auscultation bilaterally. No wheezes, rales, or rhonchi. No distress.  Abdomen:  +BS, soft, non-tender and non-distended. No rebound or guarding.  Small soft, reducible hernia just above the umbilicus.  No HSM. Msk:  Symmetrical without gross deformities. Normal posture. Extremities:  Without edema. Neurologic:  Alert and  oriented x4 Psych:  Normal mood and affect.

## 2020-09-10 ENCOUNTER — Encounter: Payer: Self-pay | Admitting: Gastroenterology

## 2020-09-10 ENCOUNTER — Other Ambulatory Visit: Payer: Self-pay

## 2020-09-10 ENCOUNTER — Ambulatory Visit: Payer: Medicare Other | Admitting: Gastroenterology

## 2020-09-10 VITALS — BP 160/98 | HR 75 | Temp 97.3°F | Ht 63.0 in | Wt 155.2 lb

## 2020-09-10 DIAGNOSIS — K439 Ventral hernia without obstruction or gangrene: Secondary | ICD-10-CM

## 2020-09-10 DIAGNOSIS — D500 Iron deficiency anemia secondary to blood loss (chronic): Secondary | ICD-10-CM

## 2020-09-10 DIAGNOSIS — R1310 Dysphagia, unspecified: Secondary | ICD-10-CM | POA: Diagnosis not present

## 2020-09-10 DIAGNOSIS — R197 Diarrhea, unspecified: Secondary | ICD-10-CM | POA: Diagnosis not present

## 2020-09-10 DIAGNOSIS — K219 Gastro-esophageal reflux disease without esophagitis: Secondary | ICD-10-CM | POA: Diagnosis not present

## 2020-09-10 NOTE — Assessment & Plan Note (Addendum)
Small reducible ventral hernia just above the umbilicus.  Discussed referral to general surgery for consideration of repair as patient reports her hernia will bulge out at times and become somewhat hard with associated mild discomfort but no true pain. Hernia has always reduced fairly easily.  She prefers to continue to monitor for now and will let us know if she would like a referral.  Plan: 1.  Discussed using an abdominal binder to prevent hernia from bulging out. 2.  She will let us know if she would like a surgical referral. 3.  Discussed ER precautions.  Advised to proceed to the emergency room if hernia becomes persistently painful or will not reduce.

## 2020-09-10 NOTE — Patient Instructions (Addendum)
For loose stools: Add Metamucil daily.  You may follow the instructions on the container. Keep a stool and diet log of when you have loose stools and foods eaten around this time.  This will hopefully help to identify some dietary triggers. Follow a low FODMAP diet.  See low FODMAP diet handout below. You may continue to use Imodium as needed.  For GERD: Continue taking omeprazole 40 mg daily 30 minutes before breakfast. Follow a GERD diet:   Avoid fried, fatty, greasy, spicy, citrus foods.  Avoid caffeine and carbonated beverages.  Avoid chocolate.  Try eating 4-6 small meals a day rather than 3 large meals.  Do not eat within 3 hours of laying down.  Prop head of bed up on wood or bricks to create a 6 inch incline.  For trouble swallowing: Eat slowly, take small bites, chew thoroughly. Avoid talking while eating. Have your chin tuck down while swallowing liquids. As you requested, we will continue to monitor for now.  Please let me know of any worsening symptoms.  We can arrange for a swallowing study if needed.  For your hernia: You may use an abdominal binder to keep the hernia reduced. Please let me know if you desire referral to general surgery to discuss surgical correction. If the hernia protrudes and will not reduce or if you have any persistent pain in this area, you should proceed to the emergency room.  Elevated blood pressure: Your blood pressure is a little elevated today. You may check this again when you get home.  If you have persistent elevation greater than 150/90, would recommend discussing further with your primary care. In general, you may also want to arrange a follow-up with primary care to follow-up on this.  We will plan to see back in 3 months.  Do not hesitate to call if you have questions or concerns prior.   It was great to see you today!   Aliene Altes, PA-C [facility]   Low-FODMAP Eating Plan  FODMAP stands for fermentable  oligosaccharides, disaccharides, monosaccharides, and polyols. These are sugars that are hard for some people to digest. A low-FODMAP eating plan may help some people who have irritable bowel syndrome (IBS) and certain other bowel (intestinal) diseases to manage their symptoms. This meal plan can be complicated to follow. Work with a diet and nutrition specialist (dietitian) to make a low-FODMAP eating plan that is right for you. A dietitian can help make sure that you get enough nutrition from this diet. What are tips for following this plan? Reading food labels  Check labels for hidden FODMAPs such as: ? High-fructose syrup. ? Honey. ? Agave. ? Natural fruit flavors. ? Onion or garlic powder.  Choose low-FODMAP foods that contain 3-4 grams of fiber per serving.  Check food labels for serving sizes. Eat only one serving at a time to make sure FODMAP levels stay low. Shopping  Shop with a list of foods that are recommended on this diet and make a meal plan. Meal planning  Follow a low-FODMAP eating plan for up to 6 weeks, or as told by your health care provider or dietitian.  To follow the eating plan: 1. Eliminate high-FODMAP foods from your diet completely. Choose only low-FODMAP foods to eat. You will do this for 2-6 weeks. 2. Gradually reintroduce high-FODMAP foods into your diet one at a time. Most people should wait a few days before introducing the next new high-FODMAP food into their meal plan. Your dietitian can recommend  how quickly you may reintroduce foods. 3. Keep a daily record of what and how much you eat and drink. Make note of any symptoms that you have after eating. 4. Review your daily record with a dietitian regularly to identify which foods you can eat and which foods you should avoid. General tips  Drink enough fluid each day to keep your urine pale yellow.  Avoid processed foods. These often have added sugar and may be high in FODMAPs.  Avoid most dairy  products, whole grains, and sweeteners.  Work with a dietitian to make sure you get enough fiber in your diet.  Avoid high FODMAP foods at meals to manage symptoms. Recommended foods Fruits Bananas, oranges, tangerines, lemons, limes, blueberries, raspberries, strawberries, grapes, cantaloupe, honeydew melon, kiwi, papaya, passion fruit, and pineapple. Limited amounts of dried cranberries, banana chips, and shredded coconut. Vegetables Eggplant, zucchini, cucumber, peppers, green beans, bean sprouts, lettuce, arugula, kale, Swiss chard, spinach, collard greens, bok choy, summer squash, potato, and tomato. Limited amounts of corn, carrot, and sweet potato. Green parts of scallions. Grains Gluten-free grains, such as rice, oats, buckwheat, quinoa, corn, polenta, and millet. Gluten-free pasta, bread, or cereal. Rice noodles. Corn tortillas. Meats and other proteins Unseasoned beef, pork, poultry, or fish. Eggs. Berniece Salines. Tofu (firm) and tempeh. Limited amounts of nuts and seeds, such as almonds, walnuts, [place] nuts, pecans, peanuts, nut butters, pumpkin seeds, chia seeds, and sunflower seeds. Dairy Lactose-free milk, yogurt, and kefir. Lactose-free cottage cheese and ice cream. Non-dairy milks, such as almond, coconut, hemp, and rice milk. Non-dairy yogurt. Limited amounts of goat cheese, brie, mozzarella, parmesan, swiss, and other hard cheeses. Fats and oils Butter-free spreads. Vegetable oils, such as olive, canola, and sunflower oil. Seasoning and other foods Artificial sweeteners with names that do not end in "ol," such as aspartame, saccharine, and stevia. Maple syrup, white table sugar, raw sugar, brown sugar, and molasses. Mayonnaise, soy sauce, and tamari. Fresh basil, coriander, parsley, rosemary, and thyme. Beverages Water and mineral water. Sugar-sweetened soft drinks. Small amounts of orange juice or cranberry juice. Black and green tea. Most dry wines. Coffee. The items listed above  may not be a complete list of foods and beverages you can eat. Contact a dietitian for more information. Foods to avoid Fruits Fresh, dried, and juiced forms of apple, pear, watermelon, peach, plum, cherries, apricots, blackberries, boysenberries, figs, nectarines, and mango. Avocado. Vegetables Chicory root, artichoke, asparagus, cabbage, snow peas, Brussels sprouts, broccoli, sugar snap peas, mushrooms, celery, and cauliflower. Onions, garlic, leeks, and the white part of scallions. Grains Wheat, including kamut, durum, and semolina. Barley and bulgur. Couscous. Wheat-based cereals. Wheat noodles, bread, crackers, and pastries. Meats and other proteins Fried or fatty meat. Sausage. Cashews and pistachios. Soybeans, baked beans, black beans, chickpeas, kidney beans, fava beans, navy beans, lentils, black-eyed peas, and split peas. Dairy Milk, yogurt, ice cream, and soft cheese. Cream and sour cream. Milk-based sauces. Custard. Buttermilk. Soy milk. Seasoning and other foods Any sugar-free gum or candy. Foods that contain artificial sweeteners such as sorbitol, mannitol, isomalt, or xylitol. Foods that contain honey, high-fructose corn syrup, or agave. Bouillon, vegetable stock, beef stock, and chicken stock. Garlic and onion powder. Condiments made with onion, such as hummus, chutney, pickles, relish, salad dressing, and salsa. Tomato paste. Beverages Chicory-based drinks. Coffee substitutes. Chamomile tea. Fennel tea. Sweet or fortified wines such as port or sherry. Diet soft drinks made with isomalt, mannitol, maltitol, sorbitol, or xylitol. Apple, pear, and mango juice. Juices with high-fructose corn  syrup. The items listed above may not be a complete list of foods and beverages you should avoid. Contact a dietitian for more information. Summary  FODMAP stands for fermentable oligosaccharides, disaccharides, monosaccharides, and polyols. These are sugars that are hard for some people to  digest.  A low-FODMAP eating plan is a short-term diet that helps to ease symptoms of certain bowel diseases.  The eating plan usually lasts up to 6 weeks. After that, high-FODMAP foods are reintroduced gradually and one at a time. This can help you find out which foods may be causing symptoms.  A low-FODMAP eating plan can be complicated. It is best to work with a dietitian who has experience with this type of plan. This information is not intended to replace advice given to you by your health care provider. Make sure you discuss any questions you have with your health care provider. Document Revised: 10/06/2019 Document Reviewed: 10/06/2019 Elsevier Patient Education  [place].

## 2020-09-10 NOTE — Assessment & Plan Note (Signed)
Well-controlled on omeprazole 40 mg daily.  Advised she continue her current medications.

## 2020-09-10 NOTE — Assessment & Plan Note (Addendum)
History of dysphagia/odynophagia with EGD in August 2021 revealing moderate Schatzki's ring s/p dilation.  Symptoms resolved after dilation.  She reports occasional trouble when trying to swallow rice.  Also reports getting "strangled" at least once daily with liquids and occasionally foods wanting to go down the wrong way.  She will have to cough quite a bit to clear her airway.  GERD remains well controlled on omeprazole 40 mg daily.  Discussed BPE or possible modified barium swallow.  Patient prefers to monitor for now.  She feels this is likely related to eating or drinking quickly and not paying attention.   Plan: 1.  Eat slowly, take small bites, chew thoroughly. 2.  Avoid talking while eating. 3.  Chin tuck while swallowing liquids. 4.  Patient will continue to monitor for now as she requested and let us know of any worsening symptoms.

## 2020-09-10 NOTE — Assessment & Plan Note (Addendum)
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  She has been maintained on oral iron.  Most recent labs in our system January 2021 with hemoglobin 12.2.  She reports she is no longer taking iron.  No overt GI bleeding or other overt bleeding.  Reports having labs completed earlier this year with primary care.  Most recent EGD and colonoscopy in August 2021 as per HPI.  Gastric biopsies were negative for H. pylori.  She is due for repeat colonoscopy in 2026.  She continues routine use of NSAIDs due to chronic tongue pain.  She is maintained on omeprazole 40 mg daily.   I am requesting recent labs from PCP for review.

## 2020-09-10 NOTE — Assessment & Plan Note (Addendum)
75 year old female with at least a 2-year history of loose bowel movements.  Currently with 3-4 loose stools daily, but can have intermittent flares of more frequent loose to watery bowel movements every 2 to 3 weeks without identified trigger.  History of C. difficile in 2018 treated with Flagyl with significant improvement.  Additional evaluation in February 2021 for diarrhea with normal thyroid function, TTG IgA within normal limits, Giardia testing negative, fecal lactoferrin within normal limits.  Trial of lactose-free diet not helpful.  Ultimately underwent colonoscopy in August 2021 revealing nonbleeding internal hemorrhoids, few small diverticula in the sigmoid colon, normal examined ileum, random colon biopsies benign.  Recommended repeat in 5 years.  She denies BRBPR, melena, abdominal pain, or weight loss.  She does admit to occasional hot flashes every now and then but nothing routine and does not associate this with increased stool frequency. No nocturnal BMs. No history of diabetes.  Etiology of loose stool is not clear.  There may be some unidentified dietary intolerances. Not consistent with infectious diarrhea. No history of diabetes or pancreatitis to suggest pancreatic insufficiency or chronic pancreatitis. Considered med effect in the setting of magnesium, but patient reports loose stools predate start of magnesium. Can't rule out carcinoid syndrome or occult malignancy though I think these are less likely.   Plan:  1.  Trial Metamucil daily.  2.  Advised she follow a low FODMAP diet. Handout provided.  3.  Requested patient keep a dietary/stool log and bring to her next appointment.  4.  Considered 5 HIAA testing and possible CT abdomen pelvis, but patient prefers to try the above first. 5.  Use Imodium as needed. 6.  Follow-up in 3 months.

## 2020-09-12 ENCOUNTER — Ambulatory Visit (HOSPITAL_COMMUNITY)
Admission: RE | Admit: 2020-09-12 | Discharge: 2020-09-12 | Disposition: A | Discharge status: Home / Self Care | Payer: Medicare Other | Source: Ambulatory Visit | Attending: Physician Assistant | Admitting: Physician Assistant

## 2020-09-12 ENCOUNTER — Other Ambulatory Visit: Payer: Self-pay

## 2020-09-12 DIAGNOSIS — M85852 Other specified disorders of bone density and structure, left thigh: Secondary | ICD-10-CM | POA: Diagnosis not present

## 2020-09-12 DIAGNOSIS — E2839 Other primary ovarian failure: Secondary | ICD-10-CM | POA: Insufficient documentation

## 2020-09-12 DIAGNOSIS — Z780 Asymptomatic menopausal state: Secondary | ICD-10-CM | POA: Diagnosis not present

## 2020-09-12 DIAGNOSIS — M810 Age-related osteoporosis without current pathological fracture: Secondary | ICD-10-CM | POA: Diagnosis not present

## 2020-09-29 DIAGNOSIS — E7849 Other hyperlipidemia: Secondary | ICD-10-CM | POA: Diagnosis not present

## 2020-10-07 ENCOUNTER — Telehealth: Payer: Self-pay | Admitting: Gastroenterology

## 2020-10-07 NOTE — Telephone Encounter (Signed)
Received and reviewed labs dated 03/27/2020.  CBC: WBC 9.5, hemoglobin 13.3, hematocrit 40.4, MCV 92, MCH 30.2, MCHC 32.9, platelets 344 CMP: Glucose 93, BUN 12, creatinine 0.67, sodium 138, potassium 4.6, chloride 98, calcium 9.8, total protein 7.6, albumin 4.6, total bilirubin 0.3, alk phos 96, AST 15, ALT 15   Jocelyn Love: Please let patient know I have received and reviewed labs from her primary care provider. Labs were completed in October 2021.  Her hemoglobin was within normal limits.  With her history of iron deficiency anemia, recommend we go ahead and update CBC and iron panel at this time as it has been more than 6 months since her labs were checked. Please arrange.

## 2020-10-08 NOTE — Telephone Encounter (Signed)
Phone the pt and her vm box has not been set up yet.

## 2020-10-09 ENCOUNTER — Other Ambulatory Visit: Payer: Self-pay

## 2020-10-09 DIAGNOSIS — D649 Anemia, unspecified: Secondary | ICD-10-CM

## 2020-10-09 DIAGNOSIS — D500 Iron deficiency anemia secondary to blood loss (chronic): Secondary | ICD-10-CM

## 2020-10-09 NOTE — Progress Notes (Signed)
Dear Charlett Nose,     We have tried to reach you on several occasions regarding the labs that Aliene Altes received from your PCP dated 03/27/2020. Your hemoglobin is within normal limits but with your history of iron deficiency anemia she is recommending that you go ahead and update your CBC and Iron Panel at this time.  It has been more than 6 months since your labs have been checked. Therefore I have enclosed lab forms for you to go to the lab and have done for [place]. You do not have to fast for these labs.    If you have any questions or concerns please call our office @ [phone].   Thank you, Oleh Genin, CMA

## 2020-10-09 NOTE — Telephone Encounter (Signed)
Phoned the pt and her vm box has not been set up.

## 2020-10-09 NOTE — Telephone Encounter (Signed)
Sent the pt her result note and lab forms with instructions

## 2020-10-31 ENCOUNTER — Other Ambulatory Visit: Payer: Self-pay | Admitting: Gastroenterology

## 2020-10-31 DIAGNOSIS — D500 Iron deficiency anemia secondary to blood loss (chronic): Secondary | ICD-10-CM | POA: Diagnosis not present

## 2020-10-31 DIAGNOSIS — D649 Anemia, unspecified: Secondary | ICD-10-CM | POA: Diagnosis not present

## 2020-11-01 LAB — CBC WITH DIFFERENTIAL/PLATELET
Basophils Absolute: 0.1 10*3/uL (ref 0.0–0.2)
Basos: 1 %
EOS (ABSOLUTE): 0.2 10*3/uL (ref 0.0–0.4)
Eos: 1 %
Hematocrit: 36.2 % (ref 34.0–46.6)
Hemoglobin: 11.8 g/dL (ref 11.1–15.9)
Immature Grans (Abs): 0 10*3/uL (ref 0.0–0.1)
Immature Granulocytes: 0 %
Lymphocytes Absolute: 5.5 10*3/uL — ABNORMAL HIGH (ref 0.7–3.1)
Lymphs: 45 %
MCH: 30.2 pg (ref 26.6–33.0)
MCHC: 32.6 g/dL (ref 31.5–35.7)
MCV: 93 fL (ref 79–97)
Monocytes Absolute: 0.7 10*3/uL (ref 0.1–0.9)
Monocytes: 6 %
Neutrophils Absolute: 5.8 10*3/uL (ref 1.4–7.0)
Neutrophils: 47 %
Platelets: 302 10*3/uL (ref 150–450)
RBC: 3.91 x10E6/uL (ref 3.77–5.28)
RDW: 12.5 % (ref 11.7–15.4)
WBC: 12.3 10*3/uL — ABNORMAL HIGH (ref 3.4–10.8)

## 2020-11-01 LAB — IRON AND TIBC
Iron Saturation: 25 % (ref 15–55)
Iron: 91 ug/dL (ref 27–139)
Total Iron Binding Capacity: 357 ug/dL (ref 250–450)
UIBC: 266 ug/dL (ref 118–369)

## 2020-11-01 LAB — FERRITIN: Ferritin: 52 ng/mL (ref 15–150)

## 2020-11-07 ENCOUNTER — Other Ambulatory Visit: Payer: Self-pay | Admitting: *Deleted

## 2020-11-07 DIAGNOSIS — K439 Ventral hernia without obstruction or gangrene: Secondary | ICD-10-CM

## 2020-11-20 DIAGNOSIS — K1329 Other disturbances of oral epithelium, including tongue: Secondary | ICD-10-CM | POA: Diagnosis not present

## 2020-11-29 DIAGNOSIS — E782 Mixed hyperlipidemia: Secondary | ICD-10-CM | POA: Diagnosis not present

## 2020-12-04 NOTE — Progress Notes (Signed)
Referring Provider: Sharilyn Sites, MD Primary Care Physician:  Sharilyn Sites, MD Primary GI Physician: Dr. Abbey Chatters  Chief Complaint  Patient presents with   Diarrhea    Food getting hung if she doesn't take omeprazole    HPI:   Jocelyn Love is a 75 y.o. female presenting today for follow-up of diarrhea and IDA.  Also reported episode of dysphagia and discussed ventral hernia.  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  EGD also completed in August 2021 revealing moderate Schatzki's ring s/p dilation, small hiatal hernia, diffuse inflammation in the entire stomach (biopsies negative for H. pylori), normal examined duodenum with benign biopsy.  Omeprazole increased to 40 mg twice daily at that time. Also with history of ventral hernia, GERD, and chronic loose stools.  History of C. difficile in 2018.  TSH normal February 2021, Giardia and O&P negative, fecal lactoferrin normal.  Prior trial of lactose-free diet not helpful.  Last seen in our office 09/10/2020.  GERD was well controlled on omeprazole 40 mg daily.  Prefer to continue to monitor ventral hernia rather than surgical referral.  Continues with occasional diarrhea.  Typically with 3-4 loose BMs daily with no associated abdominal pain, BRBPR, or melena.  Increased frequency of watery stools every 2 to 3 weeks without identified trigger.  Using Imodium as needed.  Regarding history of IDA, she was no longer taking iron.  Queried whether patient may have identified dietary intolerances contributing to diarrhea.  Less suspicious for pancreatic insufficiency with no history of diabetes or pancreatitis.  Considered CT A/P and 5 HIAA testing, but patient  preferred to hold off on this.  Plan to trial Metamucil daily, low FODMAP diet, keep dietary/stool log, continue to use Imodium as needed.  I would also request labs from PCP due to history of anemia.  Received labs completed 03/27/2020 with hemoglobin 13.3 with normocytic indices.  Recommended updating CBC and iron panel.  Labs completed 10/31/2020 with hemoglobin 11.8, iron 91, ferritin 52.  Advised it was okay to continue to hold iron for now and we will continue to monitor.   Patient requested referral to general surgery for ventral hernia on 6/7.  Per review of referral notes, patient was referred to [facility] and declined being seen there.  Today:   Dysphagia: If she forgets to take omeprazole, she has trouble with foods getting hung in her throat. Forgot to take yesterday and had grits get hung. Required regurgitation. GERD is well controlled on omeprazole. Taking OTC omeprazole 20 mg daily.   Diarrhea: 4-6 Bms in the morning. Usually no Bms the rest of the day. Doesn't always start out as diarrhea. Can start out formed. As the morning progresses, she can end up with watery Bms. Not every day, but about 4 times a week. No abdominal pain, blood in the stool or black stool. Can look oily/greasy. Weight is stable. Occasional hot flashes but nothing significant. Not associated on days with increased stool frequency. Not sure if this is associated with diarrhea.   Never tried metamucil. No specific food trigger. No food/stool log. Rare use of imodium.   Continues to require ibuprofen frequently for her tongue pain. Several times a day.   IDA: Doesn't take iron.  Prefers not to take this.  No overt GI bleeding.  Ventral Hernia: No associated pain. Wants to wait on referral. She will let us know when she is ready. Wants referral to [facility].   Elevated BP: Has not taken BP medications today.  Asymptomatic.    Past Medical History:  Diagnosis Date   Anemia    Chronic  nausea    Clostridium difficile diarrhea 11/2016   treated with flagyl   Depression    Headache    HLD (hyperlipidemia)     Past Surgical History:  Procedure Laterality Date   ABDOMINAL HYSTERECTOMY     BIOPSY  01/20/2020   Procedure: BIOPSY;  Surgeon: Eloise Harman, DO;  Location: [facility];  Service: Endoscopy;;  duodenal, antrum body   BIOPSY TONGUE     BLADDER SURGERY     BREAST SURGERY     augmentation   COLONOSCOPY N/A 11/26/2016   Procedure: COLONOSCOPY;  Surgeon: Danie Binder, MD;  10 mm cecal polyp removed piecemeal and treated with APC, 4 mm in the sigmoid colon, internal hemorrhoids.  Pathology with tubular adenoma in the cecum and hyperplastic polyp in the sigmoid.  Recommended repeat colonoscopy in 3 years.   COLONOSCOPY WITH PROPOFOL N/A 01/20/2020   Procedure: COLONOSCOPY WITH PROPOFOL;  Surgeon: Eloise Harman, DO; Nonbleeding internal hemorrhoids, few small diverticula in sigmoid colon, normal examined ileum, random colon biopsies taken (benign).  Recommended repeat colonoscopy in 5 years.   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  No H. pylori, benign small bowel biopsies,   ESOPHAGOGASTRODUODENOSCOPY N/A 12/23/2016   Procedure: ESOPHAGOGASTRODUODENOSCOPY (EGD);  Surgeon: Danie Binder, MD;  Moderate Schatzki ring, mild gastritis, moderate postulcer deformity in the duodenal bulb, Givens capsule released into the duodenum.   ESOPHAGOGASTRODUODENOSCOPY (EGD) WITH PROPOFOL N/A 01/20/2020   Procedure: ESOPHAGOGASTRODUODENOSCOPY (EGD) WITH PROPOFOL;  Surgeon: Eloise Harman, DO; moderate Schatzki's ring s/p dilation, small hiatal hernia, diffuse inflammation in the entire stomach (gastritis, no H. pylori), normal  examined duodenum s/p biopsy (benign).   GIVENS CAPSULE STUDY N/A 12/23/2016   Procedure: GIVENS CAPSULE STUDY;  Surgeon: Danie Binder, MD; small bowel AVMs   SAVORY DILATION N/A 11/26/2016   Procedure: SAVORY DILATION;  Surgeon: Danie Binder, MD;  Location: [facility];  Service: Endoscopy;  Laterality: N/A;    Current Outpatient Medications  Medication Sig Dispense Refill   acetaminophen (TYLENOL) 500 MG tablet Take 1,000 mg by mouth every 6 (six) hours.     amLODipine (NORVASC) 5 MG tablet Take 5 mg by mouth daily.     DULoxetine (CYMBALTA) 30 MG capsule Take 30 mg by mouth daily.     gabapentin (NEURONTIN) 300 MG capsule Take 300 mg by mouth 3 (three) times daily. 600mg  in morning and 300mg  at night     ibuprofen (ADVIL) 200 MG tablet Take 600 mg by mouth every 6 (six) hours as needed for moderate pain.      omeprazole (PRILOSEC) 40 MG capsule Take 1 capsule (40 mg total) by mouth 2 (two) times daily before a meal. (Patient taking differently: Take 40 mg by mouth daily.) 60 capsule 5   rosuvastatin (CRESTOR) 10 MG tablet Take 10 mg by mouth daily.     No current facility-administered medications for this visit.    Allergies  as of 12/05/2020 - Review Complete 12/05/2020  Allergen Reaction Noted   Codeine Rash 01/20/2016   Sulfa antibiotics Rash 01/20/2016    Family History  Problem Relation Age of Onset   Colon cancer Neg Hx     Social History   Socioeconomic History   Marital status: Widowed    Spouse name: Not on file   Number of children: Not on file   Years of education: Not on file   Highest education level: Not on file  Occupational History   Not on file  Tobacco Use   Smoking status: Former    Pack years: 0.00    Types: Cigarettes   Smokeless tobacco: Never  Vaping Use   Vaping Use: Never used  Substance and Sexual Activity   Alcohol use: Yes    Comment: occ glass of wine   Drug use: No   Sexual activity: Not on file  Other Topics Concern   Not on  file  Social History Narrative   Not on file   Social Determinants of Health   Financial Resource Strain: Not on file  Food Insecurity: Not on file  Transportation Needs: Not on file  Physical Activity: Not on file  Stress: Not on file  Social Connections: Not on file    Review of Systems: Gen: Denies fever, chills, cold or flulike symptoms, unintentional weight loss. CV: Denies chest pain or palpitations. Resp: Denies dyspnea or cough. GI: See HPI Derm: Denies rash Heme: See HPI  Physical Exam: BP (!) 176/101   Pulse 74   Temp 97.7 F (36.5 C) (Temporal)   Ht 5\' 2"  (1.575 m)   Wt 155 lb 12.8 oz (70.7 kg)   BMI 28.50 kg/m  General:   Alert and oriented. No distress noted. Pleasant and cooperative.  Head:  Normocephalic and atraumatic. Eyes:  Conjuctiva clear without scleral icterus. Heart:  S1, S2 present without murmurs appreciated. Lungs:  Clear to auscultation bilaterally. No wheezes, rales, or rhonchi. No distress.  Abdomen:  +BS, soft, non-tender and non-distended. No rebound or guarding. No HSM or masses noted. Msk:  Symmetrical without gross deformities. Normal posture. Extremities:  Without edema. Neurologic:  Alert and  oriented x4 Psych: Normal mood and affect.   Assessment:  75 year old female presenting today for follow-up of chronic diarrhea, history of IDA, and GERD.  Also discussed known ventral hernia and dysphagia.  Chronic diarrhea: Typically with 4-6 BMs in the morning about 4 days a week.  Stool start out as formed, but can become loose to watery.  Reports stools look oily/greasy.  Denies BRBPR, melena, abdominal pain. Weight has remained stable. Occasional hot flashes but nothing routine and not necessarily associated with increased frequency. History of C. difficile in 2018, but symptoms were not consistent with infectious diarrhea.  Additional prior evaluation has included Giardia and O&P negative, fecal lactoferrin normal, TSH normal, EGD in 2021  with benign duodenal biopsies, colonoscopy in 2021 with normal appearing colonic mucosa and TI with benign random colon biopsies, trial of lactose-free diet not helpful.    Query whether she may have pancreatic insufficiency with reports of oily/greasy stools.  We will plan to check fecal elastase and also provide samples of Creon to trial empirically.  We will also complete blood work to screen for celiac disease. Notably, she also takes ibuprofen daily due to chronic tongue pain.  Query whether this may be contributing to chronic GI inflammation and chronic loose stools.  We will hold off on CT and  5 HIAA testing per patient's request (wants to focus on 1 thing at a time). Requested progress report in a couple of weeks on how pancreatic enzymes are working.   GERD with episode of dysphagia: Fairly well controlled on OTC omeprazole 20 mg daily.  She is prescribed 40 mg daily, but had OTC omeprazole on hand and wanted to complete this first.  Reports having an episode of dysphagia with grits requiring regurgitation yesterday after she missed her dose of omeprazole.  Usually, dysphagia is not a problem.  She does have history of Schatzki's ring s/p dilation in August 2021. Recommended resuming omeprazole 40 mg daily. Concerned about significant chronic NSAID use with known gastritis. Monitor for return of dysphagia symptoms.   Ventral Hernia: Small reducible ventral hernia just above the umbilicus.  No associated discomfort.  States it occasionally gets hard.  Discussed referral to general surgery, but she prefers to hold off.  Notably, if referred, she wants to be referred to general surgeon in [place].  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  She underwent EGD, Givens capsule, and colonoscopy in 2018.  EGD and colonoscopy updated in 2021 with diffuse inflammation in the entire stomach, biopsies negative for H. pylori.  No significant findings on  colonoscopy, due for repeat in 2026.  No overt GI bleeding.  Unfortunately, she does continue with significant NSAID use due to chronic tongue pain. Labs up-to-date in June 2021 with hemoglobin 11.8, iron 91, ferritin 52.  She has not taken oral iron in quite some time and prefers not to take this.  We will continue to monitor.  Plan to update CBC and iron panel in October 2022.  Plan:  Fecal elastase, TTG IgA, IgA total. Trial Creon 72,000 units with meals 3 times daily and 36,000 units with snacks twice daily.  Samples provided.  Requested progress report in a couple of weeks. Low fat diet.  Resume omeprazole 40 mg daily. Monitor for recurrent dysphagia symptoms and let us know if this occurs. Requested she let us know when she is ready for referral to general surgery in [facility] for ventral hernia. Limit NSAIDs is much as possible. Update CBC and iron panel in October 2022.  Requested nursing staff to arrange. Follow-up in 3 to 4 months.   Aliene Altes, PA-C [facility] 12/05/2020

## 2020-12-05 ENCOUNTER — Encounter: Payer: Self-pay | Admitting: Gastroenterology

## 2020-12-05 ENCOUNTER — Other Ambulatory Visit: Payer: Self-pay

## 2020-12-05 ENCOUNTER — Ambulatory Visit: Payer: Medicare Other | Admitting: Gastroenterology

## 2020-12-05 VITALS — BP 176/101 | HR 74 | Temp 97.7°F | Ht 62.0 in | Wt 155.8 lb

## 2020-12-05 DIAGNOSIS — D509 Iron deficiency anemia, unspecified: Secondary | ICD-10-CM | POA: Diagnosis not present

## 2020-12-05 DIAGNOSIS — K439 Ventral hernia without obstruction or gangrene: Secondary | ICD-10-CM

## 2020-12-05 DIAGNOSIS — R1310 Dysphagia, unspecified: Secondary | ICD-10-CM

## 2020-12-05 DIAGNOSIS — K529 Noninfective gastroenteritis and colitis, unspecified: Secondary | ICD-10-CM | POA: Diagnosis not present

## 2020-12-05 DIAGNOSIS — K219 Gastro-esophageal reflux disease without esophagitis: Secondary | ICD-10-CM | POA: Diagnosis not present

## 2020-12-05 NOTE — Patient Instructions (Addendum)
Please have fecal elastase testing and blood work completed at [facility].   We are providing you with samples of Creon (pancreatic enzyme) to help with diarrhea.  Take 2 capsules with meals 3 times a day and 1 capsule with snacks up to twice a day.  Please call with a progress report in a couple of weeks to let me know how you are doing.  In general, I recommend you follow a low-fat diet.  Avoid fried, fatty, greasy foods.  Try to limit ibuprofen and all other NSAIDs is much as you possibly can as this may be contributing to the inflammation in your GI tract and possibly your loose stools.  Continue taking omeprazole daily.  Recommend taking 40 mg daily.    Let me know if you have any recurrent swallowing problems and we can get you arrange for repeat upper endoscopy with stretching of your esophagus.  We will arrange for you to have repeat CBC and iron panel in October.  Please let me know when you are ready for referral to general surgery for your hernia.   We will plan to see you back in 3 to 4 months.  Do not hesitate to call with any questions or concerns prior.  Aliene Altes, PA-C [facility]

## 2020-12-07 ENCOUNTER — Other Ambulatory Visit: Payer: Self-pay

## 2020-12-07 DIAGNOSIS — K439 Ventral hernia without obstruction or gangrene: Secondary | ICD-10-CM

## 2020-12-11 DIAGNOSIS — K529 Noninfective gastroenteritis and colitis, unspecified: Secondary | ICD-10-CM | POA: Diagnosis not present

## 2020-12-15 LAB — TISSUE TRANSGLUTAMINASE, IGA: (tTG) Ab, IgA: <1 U/mL

## 2020-12-15 LAB — IGA: Immunoglobulin A: 34 mg/dL — ABNORMAL LOW (ref 70–320)

## 2020-12-15 LAB — TISSUE TRANSGLUTAMINASE, IGG

## 2020-12-15 LAB — GLIADIN DEAMIDATED PEPT AB,IGG

## 2020-12-18 LAB — PANCREATIC ELASTASE, FECAL: Pancreatic Elastase-1, Stool: 352 mcg/g

## 2020-12-31 ENCOUNTER — Telehealth: Payer: Self-pay | Admitting: Internal Medicine

## 2020-12-31 NOTE — Telephone Encounter (Signed)
Pt received letter that we were trying to reach her regarding her results. [phone]

## 2020-12-31 NOTE — Telephone Encounter (Signed)
Noted. Please arrange additional labs needed - Tissue transglutaminase IgG and gliadin deamidated peptide antibody IgG.   Dx: Chronic diarrhea. (See original result note from labs completed 7/6 for details).

## 2020-12-31 NOTE — Telephone Encounter (Signed)
Spoke to pt. Informed her of results and recommendations. Pt voiced understanding. Informed me that she would like labs done at [facility].

## 2021-01-01 ENCOUNTER — Other Ambulatory Visit: Payer: Self-pay | Admitting: *Deleted

## 2021-01-01 DIAGNOSIS — K529 Noninfective gastroenteritis and colitis, unspecified: Secondary | ICD-10-CM

## 2021-01-01 NOTE — Telephone Encounter (Signed)
Spoke  pt. Informed her that more labs were ordered. She requested they be sent to [facility] and would have them done by the end of the week. Pt. Voiced understanding.

## 2021-01-01 NOTE — Addendum Note (Signed)
Addended by: Inda Castle on: 01/01/2021 03:38 PM   Modules accepted: Orders

## 2021-01-01 NOTE — Telephone Encounter (Signed)
Noted  

## 2021-01-09 DIAGNOSIS — K529 Noninfective gastroenteritis and colitis, unspecified: Secondary | ICD-10-CM | POA: Diagnosis not present

## 2021-01-10 LAB — GLIADIN DEAMIDATED PEPT AB,IGA: Gliadin IgA: <1 U/mL

## 2021-01-10 LAB — TISSUE TRANSGLUTAMINASE, IGG: (tTG) Ab, IgG: <1 U/mL

## 2021-01-28 NOTE — Progress Notes (Signed)
error 

## 2021-01-29 ENCOUNTER — Telehealth: Payer: Self-pay

## 2021-01-29 NOTE — Telephone Encounter (Signed)
error 

## 2021-01-30 DIAGNOSIS — E782 Mixed hyperlipidemia: Secondary | ICD-10-CM | POA: Diagnosis not present

## 2021-02-11 ENCOUNTER — Other Ambulatory Visit: Payer: Self-pay

## 2021-02-11 DIAGNOSIS — K439 Ventral hernia without obstruction or gangrene: Secondary | ICD-10-CM

## 2021-02-11 DIAGNOSIS — K529 Noninfective gastroenteritis and colitis, unspecified: Secondary | ICD-10-CM

## 2021-02-11 DIAGNOSIS — R1310 Dysphagia, unspecified: Secondary | ICD-10-CM

## 2021-02-11 DIAGNOSIS — D509 Iron deficiency anemia, unspecified: Secondary | ICD-10-CM

## 2021-02-11 DIAGNOSIS — K219 Gastro-esophageal reflux disease without esophagitis: Secondary | ICD-10-CM

## 2021-03-01 DIAGNOSIS — E782 Mixed hyperlipidemia: Secondary | ICD-10-CM | POA: Diagnosis not present

## 2021-03-11 DIAGNOSIS — R1310 Dysphagia, unspecified: Secondary | ICD-10-CM | POA: Diagnosis not present

## 2021-03-11 DIAGNOSIS — K219 Gastro-esophageal reflux disease without esophagitis: Secondary | ICD-10-CM | POA: Diagnosis not present

## 2021-03-11 DIAGNOSIS — K529 Noninfective gastroenteritis and colitis, unspecified: Secondary | ICD-10-CM | POA: Diagnosis not present

## 2021-03-11 DIAGNOSIS — K439 Ventral hernia without obstruction or gangrene: Secondary | ICD-10-CM | POA: Diagnosis not present

## 2021-03-11 DIAGNOSIS — D509 Iron deficiency anemia, unspecified: Secondary | ICD-10-CM | POA: Diagnosis not present

## 2021-03-12 ENCOUNTER — Other Ambulatory Visit: Payer: Self-pay | Admitting: *Deleted

## 2021-03-12 DIAGNOSIS — D509 Iron deficiency anemia, unspecified: Secondary | ICD-10-CM

## 2021-03-12 DIAGNOSIS — D649 Anemia, unspecified: Secondary | ICD-10-CM

## 2021-03-12 LAB — IRON,TIBC AND FERRITIN PANEL
%SAT: 30 % (calc) (ref 16–45)
Ferritin: 34 ng/mL (ref 16–288)
Iron: 113 ug/dL (ref 45–160)
TIBC: 378 mcg/dL (calc) (ref 250–450)

## 2021-03-12 LAB — CBC WITH DIFFERENTIAL/PLATELET
Absolute Monocytes: 670 cells/uL (ref 200–950)
Basophils Absolute: 50 cells/uL (ref 0–200)
Basophils Relative: 0.5 %
Eosinophils Absolute: 190 cells/uL (ref 15–500)
Eosinophils Relative: 1.9 %
HCT: 36.1 % (ref 35.0–45.0)
Hemoglobin: 11.8 g/dL (ref 11.7–15.5)
Lymphs Abs: 4670 cells/uL — ABNORMAL HIGH (ref 850–3900)
MCH: 30 pg (ref 27.0–33.0)
MCHC: 32.7 g/dL (ref 32.0–36.0)
MCV: 91.9 fL (ref 80.0–100.0)
MPV: 10.2 fL (ref 7.5–12.5)
Monocytes Relative: 6.7 %
Neutro Abs: 4420 cells/uL (ref 1500–7800)
Neutrophils Relative %: 44.2 %
Platelets: 351 10*3/uL (ref 140–400)
RBC: 3.93 10*6/uL (ref 3.80–5.10)
RDW: 12.1 % (ref 11.0–15.0)
Total Lymphocyte: 46.7 %
WBC: 10 10*3/uL (ref 3.8–10.8)

## 2021-03-12 LAB — GLIADIN DEAMIDATED PEPT AB,IGG: Gliadin IgG: <1 U/mL

## 2021-04-01 DIAGNOSIS — E782 Mixed hyperlipidemia: Secondary | ICD-10-CM | POA: Diagnosis not present

## 2021-04-03 ENCOUNTER — Other Ambulatory Visit: Payer: Self-pay | Admitting: Internal Medicine

## 2021-04-03 DIAGNOSIS — K146 Glossodynia: Secondary | ICD-10-CM | POA: Diagnosis not present

## 2021-04-03 DIAGNOSIS — N342 Other urethritis: Secondary | ICD-10-CM | POA: Diagnosis not present

## 2021-04-11 ENCOUNTER — Ambulatory Visit: Payer: Medicare Other | Admitting: Gastroenterology

## 2021-05-09 ENCOUNTER — Other Ambulatory Visit: Payer: Self-pay

## 2021-05-09 ENCOUNTER — Ambulatory Visit: Payer: Medicare Other | Admitting: Internal Medicine

## 2021-05-09 ENCOUNTER — Encounter: Payer: Self-pay | Admitting: Internal Medicine

## 2021-05-09 VITALS — BP 173/98 | HR 79 | Temp 97.3°F | Ht 62.0 in | Wt 157.4 lb

## 2021-05-09 DIAGNOSIS — K5520 Angiodysplasia of colon without hemorrhage: Secondary | ICD-10-CM | POA: Diagnosis not present

## 2021-05-09 DIAGNOSIS — K529 Noninfective gastroenteritis and colitis, unspecified: Secondary | ICD-10-CM

## 2021-05-09 DIAGNOSIS — K439 Ventral hernia without obstruction or gangrene: Secondary | ICD-10-CM

## 2021-05-09 DIAGNOSIS — K219 Gastro-esophageal reflux disease without esophagitis: Secondary | ICD-10-CM

## 2021-05-09 NOTE — Patient Instructions (Signed)
For your chronic diarrhea, we are providing you with samples of Creon (pancreatic enzyme) to help with diarrhea.  Take 2 capsules with meals 3 times a day and 1 capsule with snacks up to twice a day.  You will know within 2 to 3 days if this helps or not.  If you note improvement then please give our office a call and we will send in formal prescription to your pharmacy.  If symptoms not improved on Creon, then I would recommend taking Imodium daily.  I am also going to print you out a low FODMAP diet which may give you an idea of certain foods to avoid.  Continue on omeprazole for your chronic reflux/difficulty swallowing.  Your most recent blood work showed stable blood counts and iron studies.  We will continue to monitor.  I will refer you to Dr. Constance Haw today to discuss potential hernia repair.  Otherwise follow-up with GI in 4 months.  It was nice seeing both of you today.  I hope you have a Merry Christmas.  Dr. Abbey Chatters  At [facility] we value your feedback. You may receive a survey about your visit today. Please share your experience as we strive to create trusting relationships with our patients to provide genuine, compassionate, quality care.  We appreciate your understanding and patience as we review any laboratory studies, imaging, and other diagnostic tests that are ordered as we care for you. Our office policy is 5 business days for review of these results, and any emergent or urgent results are addressed in a timely manner for your best interest. If you do not hear from our office in 1 week, please contact us.   We also encourage the use of MyChart, which contains your medical information for your review as well. If you are not enrolled in this feature, an access code is on this after visit summary for your convenience. Thank you for allowing [place] to be involved in your care.  It was great to see you today!  I hope you have a great rest of your  Fall!    Elon Alas. Abbey Chatters, D.O. Gastroenterology and Hepatology [facility]

## 2021-05-22 NOTE — Progress Notes (Signed)
Referring Provider: Sharilyn Sites, MD Primary Care Physician:  Sharilyn Sites, MD Primary GI:  Dr. Abbey Chatters  Chief Complaint  Patient presents with   Anemia    F/u   Diarrhea    Still ongoing, daily. No blood in stools   Hernia    Wants a referral to Dr. Constance Haw    HPI:   Jocelyn Love is a 75 y.o. female who presents to the clinic today for follow-up visit.  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  EGD, Givens capsule, and colonoscopy in 2018, last colonoscopy in August 2021 with nonbleeding internal hemorrhoids, few small diverticula in the sigmoid colon, normal examined ileum, random colon biopsies benign, due for repeat colonoscopy in 2026.  EGD also completed in August 2021 revealing moderate Schatzki's ring s/p dilation, small hiatal hernia, diffuse inflammation in the entire stomach (biopsies negative for H. pylori), normal examined duodenum with benign biopsy.  In regards to her chronic diarrhea, she has a history of C. difficile in 2019, normal TSH, O&P negative, fecal lactoferrin normal, previously tried lactose-free diet which did not help her symptoms.  Previously recommended trying Metamucil which she has not done.    Was given samples of Creon on previous visit though she states she never took this medication as she lost the instructions on how to properly take it.  Continues to have diarrhea.  Notes loose stools, primarily in the morning when she wakes up.  She will have anywhere from 4-5 loose bowel movements in the morning and no bowel movements for the rest of the day.  Celiac TTG WNL though her IgA level is low so subsequent GDP testing negative.  Past Medical History:  Diagnosis Date   Anemia    Chronic nausea    Clostridium difficile diarrhea 11/2016   treated with flagyl   Depression    Headache    HLD (hyperlipidemia)     Past Surgical History:  Procedure  Laterality Date   ABDOMINAL HYSTERECTOMY     BIOPSY  01/20/2020   Procedure: BIOPSY;  Surgeon: Eloise Harman, DO;  Location: [facility];  Service: Endoscopy;;  duodenal, antrum body   BIOPSY TONGUE     BLADDER SURGERY     BREAST SURGERY     augmentation   COLONOSCOPY N/A 11/26/2016   Procedure: COLONOSCOPY;  Surgeon: Danie Binder, MD;  10 mm cecal polyp removed piecemeal and treated with APC, 4 mm in the sigmoid colon, internal hemorrhoids.  Pathology with tubular adenoma in the cecum and hyperplastic polyp in the sigmoid.  Recommended repeat colonoscopy in 3 years.   COLONOSCOPY WITH PROPOFOL N/A 01/20/2020   Procedure: COLONOSCOPY WITH PROPOFOL;  Surgeon: Eloise Harman, DO; Nonbleeding internal hemorrhoids, few small diverticula in sigmoid colon, normal examined ileum, random colon biopsies taken (benign).  Recommended repeat colonoscopy in 5 years.   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  No H. pylori, benign small bowel biopsies,   ESOPHAGOGASTRODUODENOSCOPY N/A 12/23/2016   Procedure: ESOPHAGOGASTRODUODENOSCOPY (EGD);  Surgeon: Danie Binder, MD;  Moderate Schatzki ring, mild gastritis, moderate postulcer deformity in the duodenal bulb, Givens capsule released into the duodenum.   ESOPHAGOGASTRODUODENOSCOPY (EGD) WITH PROPOFOL N/A 01/20/2020   Procedure: ESOPHAGOGASTRODUODENOSCOPY (EGD) WITH  PROPOFOL;  Surgeon: Eloise Harman, DO; moderate Schatzki's ring s/p dilation, small hiatal hernia, diffuse inflammation in the entire stomach (gastritis, no H. pylori), normal examined duodenum s/p biopsy (benign).   GIVENS CAPSULE STUDY N/A 12/23/2016   Procedure: GIVENS CAPSULE STUDY;  Surgeon: Danie Binder, MD; small bowel AVMs   SAVORY  DILATION N/A 11/26/2016   Procedure: SAVORY DILATION;  Surgeon: Danie Binder, MD;  Location: [facility];  Service: Endoscopy;  Laterality: N/A;    Current Outpatient Medications  Medication Sig Dispense Refill   acetaminophen (TYLENOL) 500 MG tablet Take 1,000 mg by mouth every 6 (six) hours.     amLODipine (NORVASC) 5 MG tablet Take 5 mg by mouth daily.     DULoxetine (CYMBALTA) 30 MG capsule Take 30 mg by mouth daily.     gabapentin (NEURONTIN) 300 MG capsule Take 300 mg by mouth 3 (three) times daily. 600mg  in morning and 300mg  at night     ibuprofen (ADVIL) 200 MG tablet Take 600 mg by mouth every 6 (six) hours as needed for moderate pain.      omeprazole (PRILOSEC) 40 MG capsule Take 1 capsule (40 mg total) by mouth daily before breakfast. 30 capsule 5   psyllium (METAMUCIL) 58.6 % packet Take 1 packet by mouth daily. At night     rosuvastatin (CRESTOR) 10 MG tablet Take 10 mg by mouth daily.     No current facility-administered medications for this visit.    Allergies as of 05/09/2021 - Review Complete 05/09/2021  Allergen Reaction Noted   Codeine Rash 01/20/2016   Sulfa antibiotics Rash 01/20/2016    Family History  Problem Relation Age of Onset   Colon cancer Neg Hx     Social History   Socioeconomic History   Marital status: Widowed    Spouse name: Not on file   Number of children: Not on file   Years of education: Not on file   Highest education level: Not on file  Occupational History   Not on file  Tobacco Use   Smoking status: Former    Types: Cigarettes   Smokeless tobacco: Never  Vaping Use   Vaping Use: Never used  Substance and Sexual Activity   Alcohol use: Yes    Comment: occ glass of wine   Drug use: No   Sexual activity: Not on file  Other Topics Concern   Not on file  Social History Narrative   Not on file   Social Determinants of Health   Financial Resource Strain: Not on file  Food Insecurity: Not on file  Transportation Needs:  Not on file  Physical Activity: Not on file  Stress: Not on file  Social Connections: Not on file    Subjective: Review of Systems  Constitutional:  Negative for chills and fever.  HENT:  Negative for congestion and hearing loss.   Eyes:  Negative for blurred vision and double vision.  Respiratory:  Negative for cough and shortness of breath.   Cardiovascular:  Negative for chest pain and palpitations.  Gastrointestinal:  Positive for diarrhea. Negative for abdominal pain, blood in stool, constipation, heartburn, melena and vomiting.  Genitourinary:  Negative for dysuria and urgency.  Musculoskeletal:  Negative for joint pain and myalgias.  Skin:  Negative for itching and rash.  Neurological:  Negative for dizziness and headaches.  Psychiatric/Behavioral:  Negative for depression. The patient is not nervous/anxious.     Objective: BP (!) 173/98    Pulse 79  Temp (!) 97.3 F (36.3 C)    Ht 5\' 2"  (1.575 m)    Wt 157 lb 6.4 oz (71.4 kg)    BMI 28.79 kg/m  Physical Exam Constitutional:      Appearance: Normal appearance.  HENT:     Head: Normocephalic and atraumatic.  Eyes:     Extraocular Movements: Extraocular movements intact.     Conjunctiva/sclera: Conjunctivae normal.  Cardiovascular:     Rate and Rhythm: Normal rate and regular rhythm.  Pulmonary:     Effort: Pulmonary effort is normal.     Breath sounds: Normal breath sounds.  Abdominal:     General: Bowel sounds are normal.     Palpations: Abdomen is soft.  Musculoskeletal:        General: No swelling. Normal range of motion.     Cervical back: Normal range of motion and neck supple.  Skin:    General: Skin is warm and dry.     Coloration: Skin is not jaundiced.  Neurological:     General: No focal deficit present.     Mental Status: She is alert and oriented to person, place, and time.  Psychiatric:        Mood and Affect: Mood normal.        Behavior: Behavior normal.      Assessment: *Diarrhea-chronic *GERD-well-controlled on omeprazole daily *Ventral hernia *Iron deficiency anemia-stable  Plan: For patient's chronic diarrhea, I will trial her on Creon 72,000 units with meals 3 times daily and 36,000 units with snacks twice daily.  Samples provided.  She will call office if improvement noted and we will send in formal prescription.  GERD well-controlled omeprazole daily, will continue.  For her ventral hernia, I will refer to general surgery.  Patient wishes to see Dr. Constance Haw  Anemia stable, will continue to monitor.  Follow-up in 4 months.  05/22/2021 2:55 PM   Disclaimer: This note was dictated with voice recognition software. Similar sounding words can inadvertently be transcribed and may not be corrected upon review.

## 2021-05-23 ENCOUNTER — Ambulatory Visit: Payer: Medicare Other | Admitting: General Surgery

## 2021-05-23 ENCOUNTER — Encounter: Payer: Self-pay | Admitting: General Surgery

## 2021-05-23 ENCOUNTER — Other Ambulatory Visit: Payer: Self-pay

## 2021-05-23 VITALS — BP 166/99 | HR 86 | Temp 98.6°F | Resp 16 | Ht 62.0 in | Wt 156.0 lb

## 2021-05-23 DIAGNOSIS — K420 Umbilical hernia with obstruction, without gangrene: Secondary | ICD-10-CM | POA: Insufficient documentation

## 2021-05-23 NOTE — Patient Instructions (Signed)
Supraumbilical/ Umbilical Hernia, Adult A hernia is a bulge of tissue that pushes through an opening between muscles. An umbilical hernia happens in the abdomen, near the belly button (umbilicus). The hernia may contain tissues from the small intestine, large intestine, or fatty tissue covering the intestines. Umbilical hernias in adults tend to get worse over time, and they require surgical treatment. There are different types of umbilical hernias, including: Indirect hernia. This type is located just above or below the umbilicus. It is the most common type of umbilical hernia in adults. Direct hernia. This type forms through an opening formed by the umbilicus. Reducible hernia. This type of hernia comes and goes. It may be visible only when you strain, lift something heavy, or cough. This type of hernia can be pushed back into the abdomen (reduced). Incarcerated hernia. This type traps abdominal tissue inside the hernia. This type of hernia cannot be reduced. Strangulated hernia. This type of hernia cuts off blood flow to the tissues inside the hernia. The tissues can start to die if this happens. This type of hernia requires emergency treatment. What are the causes? An umbilical hernia happens when tissue inside the abdomen presses on a weak area of the abdominal muscles. What increases the risk? You may have a greater risk of this condition if you: Are obese. Have had several pregnancies. Have a buildup of fluid inside your abdomen. Have had surgery that weakens the abdominal muscles. What are the signs or symptoms? The main symptom of this condition is a painless bulge at or near the belly button. A reducible hernia may be visible only when you strain, lift something heavy, or cough. Other symptoms may include: Dull pain. A feeling of pressure. Symptoms of a strangulated hernia may include: Pain that gets increasingly worse. Nausea and vomiting. Pain when pressing on the hernia. Skin  over the hernia becoming red or purple. Constipation. Blood in the stool. How is this diagnosed? This condition may be diagnosed based on: A physical exam. You may be asked to cough or strain while standing. These actions increase the pressure inside your abdomen and can force the hernia through the opening in your muscles. Your health care provider may try to reduce the hernia by pressing on it. Your symptoms and medical history. How is this treated? Surgery is the only treatment for an umbilical hernia. Surgery for a strangulated hernia is done as soon as possible. If you have a small hernia that is not incarcerated, you may need to lose weight before having surgery. Follow these instructions at home: Lose weight, if told by your health care provider. Do not try to push the hernia back in. Watch your hernia for any changes in color or size. Tell your health care provider if any changes occur. You may need to avoid activities that increase pressure on your hernia. Do not lift anything that is heavier than 10 lb (4.5 kg), or the limit that you are told, until your health care provider says that it is safe. Take over-the-counter and prescription medicines only as told by your health care provider. Keep all follow-up visits. This is important. Contact a health care provider if: Your hernia gets larger. Your hernia becomes painful. Get help right away if: You develop sudden, severe pain near the area of your hernia. You have pain as well as nausea or vomiting. You have pain and the skin over your hernia changes color. You develop a fever or chills. Summary A hernia is a bulge of  tissue that pushes through an opening between muscles. An umbilical hernia happens near the belly button. Surgery is the only treatment for an umbilical hernia. Do not try to push your hernia back in. Keep all follow-up visits. This is important. This information is not intended to replace advice given to you by  your health care provider. Make sure you discuss any questions you have with your health care provider.  Open Hernia Repair, Adult Open hernia repair is a surgical procedure to fix a hernia. A hernia occurs when an internal organ or tissue pushes through a weak spot in the muscles along the wall of the abdomen. Hernias commonly occur in the groin and around the belly button. Most hernias tend to get worse over time. Often, surgery is done to prevent the hernia from becoming bigger, uncomfortable, or an emergency. Emergency surgery may be needed if contents of the abdomen get stuck in the opening (incarcerated hernia) or if the blood supply gets cut off (strangulated hernia). In an open repair, an incision is made in the abdomen to perform the surgery. Tell a health care provider about: Any allergies you have. All medicines you are taking, including vitamins, herbs, eye drops, creams, and over-the-counter medicines. Any problems you or family members have had with anesthetic medicines. Any blood or bone disorders you have. Any surgeries you have had. Any medical conditions you have, including any recent cold or flu (influenza)symptoms. Whether you are pregnant or may be pregnant. What are the risks? Generally, this is a safe procedure. However, problems may occur, including: Long-lasting (chronic) pain. Bleeding. Infection. Damage to the testicles. This can cause shrinking or swelling. Damage to nearby structures or organs, including the bladder, blood vessels, intestines, or nerves near the hernia. Blood clots. Trouble passing urine. Return of the hernia. What happens before the procedure? Medicines Ask your health care provider about: Changing or stopping your regular medicines. This is especially important if you are taking diabetes medicines or blood thinners. Taking medicines such as aspirin and ibuprofen. These medicines can thin your blood. Do not take these medicines unless your  health care provider tells you to take them. Taking over-the-counter medicines, vitamins, herbs, and supplements. Surgery safety Ask your health care provider: How your surgery site will be marked. What steps will be taken to help prevent infection. These steps may include: Removing hair at the surgery site. Washing skin with a germ-killing soap. Receiving antibiotic medicine. General instructions You may have an exam or testing, such as blood tests or imaging studies. Do not use any products that contain nicotine or tobacco for at least 4 weeks before the procedure. These products include cigarettes, chewing tobacco, and vaping devices, such as e-cigarettes. If you need help quitting, ask your health care provider. Let your health care provider know if you develop a cold or any infection before your surgery. If you get an infection before surgery, you may receive antibiotics to treat it. Plan to have a responsible adult take you home from the hospital or clinic. If you will be going home right after the procedure, plan to have a responsible adult care for you for the time you are told. This is important. What happens during the procedure?  An IV will be inserted into one of your veins. You will be given one or more of the following: A medicine to help you relax (sedative). A medicine to numb the area (local anesthetic). A medicine to make you fall asleep (general anesthetic). Your surgeon will  make an incision over the hernia. The tissues of the hernia will be moved back into place. The edges of the hernia may be stitched (sutured) together. The opening in the abdominal muscles will be closed with stitches (sutures). Or, your surgeon will place a mesh patch made of artificial (synthetic) material over the opening. The incision will be closed with sutures, skin glue, or adhesive strips. A bandage (dressing) may be placed over the incision. The procedure may vary among health care  providers and hospitals. What happens after the procedure? Your blood pressure, heart rate, breathing rate, and blood oxygen level will be monitored until you leave the hospital or clinic. You may be given medicine for pain. If you were given a sedative during the procedure, it can affect you for several hours. Do not drive or operate machinery until your health care provider says that it is safe. Summary Open hernia repair is a surgical procedure to fix a hernia. Hernias commonly occur in the groin and around the belly button. Emergency surgery may be needed if contents of the abdomen get stuck in the opening (incarcerated hernia) or if the blood supply gets cut off (strangulated hernia). In this procedure, an incision is made in the abdomen to perform the surgery. After the procedure, you may be given medicine for pain. This information is not intended to replace advice given to you by your health care provider. Make sure you discuss any questions you have with your health care provider. Document Revised: 01/02/2020 Document Reviewed: 01/02/2020 Elsevier Patient Education  2022 [place] Revised: 12/26/2019 Document Reviewed: 12/26/2019 Elsevier Patient Education  2022 [employer].

## 2021-05-23 NOTE — Progress Notes (Signed)
[facility] History and Physical  Reason for Referral: Umbilical hernia  Referring Physician:  Dr. Abbey Chatters   Chief Complaint   New Patient (Initial Visit)     Jocelyn Love is a 75 y.o. female.  HPI: Jocelyn Love is a very sweet 75 yo who is here with her daughter today who is an OR Therapist, sports. She reports that she has noticed a knot/ bulge in her umbilical area and that it is larger at times and causes her some discomfort. She has chronic diarrhea and prior episode of C dif and is being treated with some creon by GI. She otherwise is pretty healthy and has no cardiac issues. She is active and wants to get this hernia repaired as it is getting larger and causing her pain.   Past Medical History:  Diagnosis Date   Anemia    Chronic nausea    Clostridium difficile diarrhea 11/2016   treated with flagyl   Depression    Headache    HLD (hyperlipidemia)     Past Surgical History:  Procedure Laterality Date   ABDOMINAL HYSTERECTOMY     BIOPSY  01/20/2020   Procedure: BIOPSY;  Surgeon: Eloise Harman, DO;  Location: [facility];  Service: Endoscopy;;  duodenal, antrum body   BIOPSY TONGUE     BLADDER SURGERY     BREAST SURGERY     augmentation   COLONOSCOPY N/A 11/26/2016   Procedure: COLONOSCOPY;  Surgeon: Danie Binder, MD;  10 mm cecal polyp removed piecemeal and treated with APC, 4 mm in the sigmoid colon, internal hemorrhoids.  Pathology with tubular adenoma in the cecum and hyperplastic polyp in the sigmoid.  Recommended repeat colonoscopy in 3 years.   COLONOSCOPY WITH PROPOFOL N/A 01/20/2020   Procedure: COLONOSCOPY WITH PROPOFOL;  Surgeon: Eloise Harman, DO; Nonbleeding internal hemorrhoids, few small diverticula in sigmoid colon, normal examined ileum, random colon biopsies taken (benign).  Recommended repeat colonoscopy in 5 years.   ESOPHAGOGASTRODUODENOSCOPY N/A 11/26/2016   Procedure: ESOPHAGOGASTRODUODENOSCOPY (EGD);  Surgeon: Danie Binder, MD;   Nonobstructing Schatzki ring, mild gastritis, gastric antral angiectasia, angiectasia in the second portion of duodenum.  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  No H. pylori, benign small bowel biopsies,   ESOPHAGOGASTRODUODENOSCOPY N/A 12/23/2016   Procedure: ESOPHAGOGASTRODUODENOSCOPY (EGD);  Surgeon: Danie Binder, MD;  Moderate Schatzki ring, mild gastritis, moderate postulcer deformity in the duodenal bulb, Givens capsule released into the duodenum.   ESOPHAGOGASTRODUODENOSCOPY (EGD) WITH PROPOFOL N/A 01/20/2020   Procedure: ESOPHAGOGASTRODUODENOSCOPY (EGD) WITH PROPOFOL;  Surgeon: Eloise Harman, DO; moderate Schatzki's ring s/p dilation, small hiatal hernia, diffuse inflammation in the entire stomach (gastritis, no H. pylori), normal examined duodenum s/p biopsy (benign).   GIVENS CAPSULE STUDY N/A 12/23/2016   Procedure: GIVENS CAPSULE STUDY;  Surgeon: Danie Binder, MD; small bowel AVMs   SAVORY DILATION N/A 11/26/2016   Procedure: SAVORY DILATION;  Surgeon: Danie Binder, MD;  Location: [facility];  Service: Endoscopy;  Laterality: N/A;    Family History  Problem Relation Age of Onset   Colon cancer Neg Hx     Social History   Tobacco Use   Smoking status: Former    Types: Cigarettes   Smokeless tobacco: Never  Vaping Use   Vaping Use: Never used  Substance Use Topics   Alcohol use: Yes    Comment: occ glass of wine   Drug use: No    Medications: I have reviewed the patient's current  medications. Allergies as of 05/23/2021       Reactions   Codeine Rash   Sulfa Antibiotics Rash        Medication List        Accurate as of May 23, 2021  1:17 PM. If you have any questions, ask your nurse or doctor.          acetaminophen 500 MG tablet Commonly known as: TYLENOL Take 1,000 mg by mouth every 6 (six) hours.   amLODipine 5 MG tablet Commonly known as: NORVASC Take 5 mg by mouth daily.   Creon 12000-38000 units Cpep capsule Generic  drug: lipase/protease/amylase Take 12,000 Units by mouth 3 (three) times daily with meals.   DULoxetine 30 MG capsule Commonly known as: CYMBALTA Take 30 mg by mouth daily.   gabapentin 300 MG capsule Commonly known as: NEURONTIN Take 300 mg by mouth 3 (three) times daily. 600mg  in morning and 300mg  at night   ibuprofen 200 MG tablet Commonly known as: ADVIL Take 600 mg by mouth every 6 (six) hours as needed for moderate pain.   omeprazole 40 MG capsule Commonly known as: PRILOSEC Take 1 capsule (40 mg total) by mouth daily before breakfast.   psyllium 58.6 % packet Commonly known as: METAMUCIL Take 1 packet by mouth daily. At night   rosuvastatin 10 MG tablet Commonly known as: CRESTOR Take 10 mg by mouth daily.         ROS:  A comprehensive review of systems was negative except for: Gastrointestinal: positive for diarrhea and hernia with some occasional discomfort  Musculoskeletal: positive for back pain and joint pain  Blood pressure (!) 166/99, pulse 86, temperature 98.6 F (37 C), temperature source Other (Comment), resp. rate 16, height 5\' 2"  (1.575 m), weight 156 lb (70.8 kg), SpO2 97 %. Physical Exam Vitals reviewed.  Constitutional:      Appearance: Normal appearance.  HENT:     Head: Normocephalic.     Nose: Nose normal.  Eyes:     Extraocular Movements: Extraocular movements intact.  Cardiovascular:     Rate and Rhythm: Normal rate and regular rhythm.  Pulmonary:     Effort: Pulmonary effort is normal.     Breath sounds: Normal breath sounds.  Abdominal:     General: There is no distension.     Palpations: Abdomen is soft.     Tenderness: There is abdominal tenderness.     Comments: Supraumbilical hernia with nonreducible area of fat, difficult to appreciate defect size but likely about 2 cm   Musculoskeletal:        General: Normal range of motion.     Cervical back: Normal range of motion.  Skin:    General: Skin is warm.  Neurological:      General: No focal deficit present.     Mental Status: She is alert and oriented to person, place, and time.  Psychiatric:        Mood and Affect: Mood normal.        Behavior: Behavior normal.        Thought Content: Thought content normal.    Results:  None   Assessment & Plan:  Jocelyn Love is a 75 y.o. female with an incarcerated supraumbilical hernia with likely fat. She has diarrhea at baseline. We discussed repair with mesh and risk of bleeding, infection, recurrence, injury to bowel, use of primary repair if defect is really small. Discussed post operative restrictions.   All questions were answered to  the satisfaction of the patient and family.     Virl Cagey 05/23/2021, 1:17 PM

## 2021-05-24 NOTE — H&P (Signed)
[facility] History and Physical  Reason for Referral: Umbilical hernia  Referring Physician:  Dr. Abbey Chatters   Chief Complaint   New Patient (Initial Visit)     Jocelyn Love is a 75 y.o. female.  HPI: Jocelyn Love is a very sweet 75 yo who is here with her daughter today who is an OR Therapist, sports. She reports that she has noticed a knot/ bulge in her umbilical area and that it is larger at times and causes her some discomfort. She has chronic diarrhea and prior episode of C dif and is being treated with some creon by GI. She otherwise is pretty healthy and has no cardiac issues. She is active and wants to get this hernia repaired as it is getting larger and causing her pain.   Past Medical History:  Diagnosis Date   Anemia    Chronic nausea    Clostridium difficile diarrhea 11/2016   treated with flagyl   Depression    Headache    HLD (hyperlipidemia)     Past Surgical History:  Procedure Laterality Date   ABDOMINAL HYSTERECTOMY     BIOPSY  01/20/2020   Procedure: BIOPSY;  Surgeon: Eloise Harman, DO;  Location: [facility];  Service: Endoscopy;;  duodenal, antrum body   BIOPSY TONGUE     BLADDER SURGERY     BREAST SURGERY     augmentation   COLONOSCOPY N/A 11/26/2016   Procedure: COLONOSCOPY;  Surgeon: Danie Binder, MD;  10 mm cecal polyp removed piecemeal and treated with APC, 4 mm in the sigmoid colon, internal hemorrhoids.  Pathology with tubular adenoma in the cecum and hyperplastic polyp in the sigmoid.  Recommended repeat colonoscopy in 3 years.   COLONOSCOPY WITH PROPOFOL N/A 01/20/2020   Procedure: COLONOSCOPY WITH PROPOFOL;  Surgeon: Eloise Harman, DO; Nonbleeding internal hemorrhoids, few small diverticula in sigmoid colon, normal examined ileum, random colon biopsies taken (benign).  Recommended repeat colonoscopy in 5 years.   ESOPHAGOGASTRODUODENOSCOPY N/A 11/26/2016   Procedure: ESOPHAGOGASTRODUODENOSCOPY (EGD);  Surgeon: Danie Binder, MD;   Nonobstructing Schatzki ring, mild gastritis, gastric antral angiectasia, angiectasia in the second portion of duodenum.  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  No H. pylori, benign small bowel biopsies,   ESOPHAGOGASTRODUODENOSCOPY N/A 12/23/2016   Procedure: ESOPHAGOGASTRODUODENOSCOPY (EGD);  Surgeon: Danie Binder, MD;  Moderate Schatzki ring, mild gastritis, moderate postulcer deformity in the duodenal bulb, Givens capsule released into the duodenum.   ESOPHAGOGASTRODUODENOSCOPY (EGD) WITH PROPOFOL N/A 01/20/2020   Procedure: ESOPHAGOGASTRODUODENOSCOPY (EGD) WITH PROPOFOL;  Surgeon: Eloise Harman, DO; moderate Schatzki's ring s/p dilation, small hiatal hernia, diffuse inflammation in the entire stomach (gastritis, no H. pylori), normal examined duodenum s/p biopsy (benign).   GIVENS CAPSULE STUDY N/A 12/23/2016   Procedure: GIVENS CAPSULE STUDY;  Surgeon: Danie Binder, MD; small bowel AVMs   SAVORY DILATION N/A 11/26/2016   Procedure: SAVORY DILATION;  Surgeon: Danie Binder, MD;  Location: [facility];  Service: Endoscopy;  Laterality: N/A;    Family History  Problem Relation Age of Onset   Colon cancer Neg Hx     Social History   Tobacco Use   Smoking status: Former    Types: Cigarettes   Smokeless tobacco: Never  Vaping Use   Vaping Use: Never used  Substance Use Topics   Alcohol use: Yes    Comment: occ glass of wine   Drug use: No    Medications: I have reviewed the patient's current  medications. Allergies as of 05/23/2021       Reactions   Codeine Rash   Sulfa Antibiotics Rash        Medication List        Accurate as of May 23, 2021  1:17 PM. If you have any questions, ask your nurse or doctor.          acetaminophen 500 MG tablet Commonly known as: TYLENOL Take 1,000 mg by mouth every 6 (six) hours.   amLODipine 5 MG tablet Commonly known as: NORVASC Take 5 mg by mouth daily.   Creon 12000-38000 units Cpep capsule Generic  drug: lipase/protease/amylase Take 12,000 Units by mouth 3 (three) times daily with meals.   DULoxetine 30 MG capsule Commonly known as: CYMBALTA Take 30 mg by mouth daily.   gabapentin 300 MG capsule Commonly known as: NEURONTIN Take 300 mg by mouth 3 (three) times daily. 600mg  in morning and 300mg  at night   ibuprofen 200 MG tablet Commonly known as: ADVIL Take 600 mg by mouth every 6 (six) hours as needed for moderate pain.   omeprazole 40 MG capsule Commonly known as: PRILOSEC Take 1 capsule (40 mg total) by mouth daily before breakfast.   psyllium 58.6 % packet Commonly known as: METAMUCIL Take 1 packet by mouth daily. At night   rosuvastatin 10 MG tablet Commonly known as: CRESTOR Take 10 mg by mouth daily.         ROS:  A comprehensive review of systems was negative except for: Gastrointestinal: positive for diarrhea and hernia with some occasional discomfort  Musculoskeletal: positive for back pain and joint pain  Blood pressure (!) 166/99, pulse 86, temperature 98.6 F (37 C), temperature source Other (Comment), resp. rate 16, height 5\' 2"  (1.575 m), weight 156 lb (70.8 kg), SpO2 97 %. Physical Exam Vitals reviewed.  Constitutional:      Appearance: Normal appearance.  HENT:     Head: Normocephalic.     Nose: Nose normal.  Eyes:     Extraocular Movements: Extraocular movements intact.  Cardiovascular:     Rate and Rhythm: Normal rate and regular rhythm.  Pulmonary:     Effort: Pulmonary effort is normal.     Breath sounds: Normal breath sounds.  Abdominal:     General: There is no distension.     Palpations: Abdomen is soft.     Tenderness: There is abdominal tenderness.     Comments: Supraumbilical hernia with nonreducible area of fat, difficult to appreciate defect size but likely about 2 cm   Musculoskeletal:        General: Normal range of motion.     Cervical back: Normal range of motion.  Skin:    General: Skin is warm.  Neurological:      General: No focal deficit present.     Mental Status: She is alert and oriented to person, place, and time.  Psychiatric:        Mood and Affect: Mood normal.        Behavior: Behavior normal.        Thought Content: Thought content normal.    Results:  None   Assessment & Plan:  DANAISHA CELLI is a 75 y.o. female with an incarcerated supraumbilical hernia with likely fat. She has diarrhea at baseline. We discussed repair with mesh and risk of bleeding, infection, recurrence, injury to bowel, use of primary repair if defect is really small. Discussed post operative restrictions.   All questions were answered to  the satisfaction of the patient and family.     Virl Cagey 05/23/2021, 1:17 PM

## 2021-05-31 DIAGNOSIS — E782 Mixed hyperlipidemia: Secondary | ICD-10-CM | POA: Diagnosis not present

## 2021-06-04 NOTE — Patient Instructions (Signed)
NELDA LUCKEY  06/04/2021     @PREFPERIOPPHARMACY @   Your procedure is scheduled on  06/10/2021.   Report to [facility] at  [phone]  A.M.   Call this number if you have problems the morning of surgery:  [phone]   Remember:  Do not eat or drink after midnight.      Take these medicines the morning of surgery with A SIP OF WATER                      amlodipine, cymbalta,gabapentin, prilosec.     Do not wear jewelry, make-up or nail polish.  Do not wear lotions, powders, or perfumes, or deodorant.  Do not shave 48 hours prior to surgery.  Men may shave face and neck.  Do not bring valuables to the hospital.  [facility] is not responsible for any belongings or valuables.  Contacts, dentures or bridgework may not be worn into surgery.  Leave your suitcase in the car.  After surgery it may be brought to your room.  For patients admitted to the hospital, discharge time will be determined by your treatment team.  Patients discharged the day of surgery will not be allowed to drive home and must have someone with them for 24 hours.    Special instructions:   DO NOT smoke tobacco or vape for 24 hours.  Please read over the following fact sheets that you were given. Coughing and Deep Breathing, Surgical Site Infection Prevention, Anesthesia Post-op Instructions, and Care and Recovery After Surgery      Open Hernia Repair, Adult, Care After What can I expect after the procedure? After the procedure, it is common to have: Mild discomfort. Slight bruising. Mild swelling. Pain in the belly (abdomen). A small amount of blood from the cut from surgery (incision). Follow these instructions at home: Your doctor may give you more specific instructions. If you have problems, call your doctor. Medicines Take over-the-counter and prescription medicines only as told by your doctor. If told, take steps to prevent problems with pooping (constipation). You may need  to: Drink enough fluid to keep your pee (urine) pale yellow. Take medicines. You will be told what medicines to take. Eat foods that are high in fiber. These include beans, whole grains, and fresh fruits and vegetables. Limit foods that are high in fat and sugar. These include fried or sweet foods. Ask your doctor if you should avoid driving or using machines while you are taking your medicine. Incision care  Follow instructions from your doctor about how to take care of your incision. Make sure you: Wash your hands with soap and water for at least 20 seconds before and after you change your bandage (dressing). If you cannot use soap and water, use hand sanitizer. Change your bandage. Leave stitches or skin glue in place for at least 2 weeks. Leave tape strips alone unless you are told to take them off. You may trim the edges of the tape strips if they curl up. Check your incision every day for signs of infection. Check for: More redness, swelling, or pain. More fluid or blood. Warmth. Pus or a bad smell. Wear loose, soft clothing while your incision heals. Activity  Rest as told by your doctor. Do not lift anything that is heavier than 10 lb (4.5 kg), or the limit that you are told. Do not play contact sports until your doctor says that this  is safe. If you were given a sedative during your procedure, do not drive or use machines until your doctor says that it is safe. A sedative is a medicine that helps you relax. Return to your normal activities when your doctor says that it is safe. General instructions Do not take baths, swim, or use a hot tub. Ask your doctor about taking showers or sponge baths. Hold a pillow over your belly when you cough or sneeze. This helps with pain. Do not smoke or use any products that contain nicotine or tobacco. If you need help quitting, ask your doctor. Keep all follow-up visits. Contact a doctor if: You have any of these signs of infection in or  around your incision: More redness, swelling, or pain. More fluid or blood. Warmth. Pus. A bad smell. You have a fever or chills. You have blood in your poop (stool). You have not pooped (had a bowel movement) in 2-3 days. Medicine does not help your pain. Get help right away if: You have chest pain, or you are short of breath. You feel faint or light-headed. You have very bad pain. You vomit and your pain is worse. You have pain, swelling, or redness in a leg. These symptoms may be an emergency. Get help right away. Call your local emergency services (911 in the [place] Do not wait to see if the symptoms will go away. Do not drive yourself to the hospital. Summary After this procedure, it is common to have mild discomfort, slight bruising, and mild swelling. Follow instructions from your doctor about how to take care of your cut from surgery (incision). Check every day for signs of infection. Do not lift heavy objects or play contact sports until your doctor says it is safe. Return to your normal activities as told by your doctor. This information is not intended to replace advice given to you by your health care provider. Make sure you discuss any questions you have with your health care provider. Document Revised: 01/02/2020 Document Reviewed: 01/02/2020 Elsevier Patient Education  2022 [place] Anesthesia, Adult, Care After This sheet gives you information about how to care for yourself after your procedure. Your health care provider may also give you more specific instructions. If you have problems or questions, contact your health care provider. What can I expect after the procedure? After the procedure, the following side effects are common: Pain or discomfort at the IV site. Nausea. Vomiting. Sore throat. Trouble concentrating. Feeling cold or chills. Feeling weak or tired. Sleepiness and fatigue. Soreness and body aches. These side effects can affect parts  of the body that were not involved in surgery. Follow these instructions at home: For the time period you were told by your health care provider:  Rest. Do not participate in activities where you could fall or become injured. Do not drive or use machinery. Do not drink alcohol. Do not take sleeping pills or medicines that cause drowsiness. Do not make important decisions or sign legal documents. Do not take care of children on your own. Eating and drinking Follow any instructions from your health care provider about eating or drinking restrictions. When you feel hungry, start by eating small amounts of foods that are soft and easy to digest (bland), such as toast. Gradually return to your regular diet. Drink enough fluid to keep your urine pale yellow. If you vomit, rehydrate by drinking water, juice, or clear broth. General instructions If you have sleep apnea, surgery and certain medicines can  increase your risk for breathing problems. Follow instructions from your health care provider about wearing your sleep device: Anytime you are sleeping, including during daytime naps. While taking prescription pain medicines, sleeping medicines, or medicines that make you drowsy. Have a responsible adult stay with you for the time you are told. It is important to have someone help care for you until you are awake and alert. Return to your normal activities as told by your health care provider. Ask your health care provider what activities are safe for you. Take over-the-counter and prescription medicines only as told by your health care provider. If you smoke, do not smoke without supervision. Keep all follow-up visits as told by your health care provider. This is important. Contact a health care provider if: You have nausea or vomiting that does not get better with medicine. You cannot eat or drink without vomiting. You have pain that does not get better with medicine. You are unable to pass  urine. You develop a skin rash. You have a fever. You have redness around your IV site that gets worse. Get help right away if: You have difficulty breathing. You have chest pain. You have blood in your urine or stool, or you vomit blood. Summary After the procedure, it is common to have a sore throat or nausea. It is also common to feel tired. Have a responsible adult stay with you for the time you are told. It is important to have someone help care for you until you are awake and alert. When you feel hungry, start by eating small amounts of foods that are soft and easy to digest (bland), such as toast. Gradually return to your regular diet. Drink enough fluid to keep your urine pale yellow. Return to your normal activities as told by your health care provider. Ask your health care provider what activities are safe for you. This information is not intended to replace advice given to you by your health care provider. Make sure you discuss any questions you have with your health care provider. Document Revised: 02/02/2020 Document Reviewed: 09/01/2019 Elsevier Patient Education  2022 [place]. How to Use Chlorhexidine for Bathing Chlorhexidine gluconate (CHG) is a germ-killing (antiseptic) solution that is used to clean the skin. It can get rid of the bacteria that normally live on the skin and can keep them away for about 24 hours. To clean your skin with CHG, you may be given: A CHG solution to use in the shower or as part of a sponge bath. A prepackaged cloth that contains CHG. Cleaning your skin with CHG may help lower the risk for infection: While you are staying in the intensive care unit of the hospital. If you have a vascular access, such as a central line, to provide short-term or long-term access to your veins. If you have a catheter to drain urine from your bladder. If you are on a ventilator. A ventilator is a machine that helps you breathe by moving air in and out of your  lungs. After surgery. What are the risks? Risks of using CHG include: A skin reaction. Hearing loss, if CHG gets in your ears and you have a perforated eardrum. Eye injury, if CHG gets in your eyes and is not rinsed out. The CHG product catching fire. Make sure that you avoid smoking and flames after applying CHG to your skin. Do not use CHG: If you have a chlorhexidine allergy or have previously reacted to chlorhexidine. On babies younger than 2 months  of age. How to use CHG solution Use CHG only as told by your health care provider, and follow the instructions on the label. Use the full amount of CHG as directed. Usually, this is one bottle. During a shower Follow these steps when using CHG solution during a shower (unless your health care provider gives you different instructions): Start the shower. Use your normal soap and shampoo to wash your face and hair. Turn off the shower or move out of the shower stream. Pour the CHG onto a clean washcloth. Do not use any type of brush or rough-edged sponge. Starting at your neck, lather your body down to your toes. Make sure you follow these instructions: If you will be having surgery, pay special attention to the part of your body where you will be having surgery. Scrub this area for at least 1 minute. Do not use CHG on your head or face. If the solution gets into your ears or eyes, rinse them well with water. Avoid your genital area. Avoid any areas of skin that have broken skin, cuts, or scrapes. Scrub your back and under your arms. Make sure to wash skin folds. Let the lather sit on your skin for 1-2 minutes or as long as told by your health care provider. Thoroughly rinse your entire body in the shower. Make sure that all body creases and crevices are rinsed well. Dry off with a clean towel. Do not put any substances on your body afterward--such as powder, lotion, or perfume--unless you are told to do so by your health care provider.  Only use lotions that are recommended by the manufacturer. Put on clean clothes or pajamas. If it is the night before your surgery, sleep in clean sheets.  During a sponge bath Follow these steps when using CHG solution during a sponge bath (unless your health care provider gives you different instructions): Use your normal soap and shampoo to wash your face and hair. Pour the CHG onto a clean washcloth. Starting at your neck, lather your body down to your toes. Make sure you follow these instructions: If you will be having surgery, pay special attention to the part of your body where you will be having surgery. Scrub this area for at least 1 minute. Do not use CHG on your head or face. If the solution gets into your ears or eyes, rinse them well with water. Avoid your genital area. Avoid any areas of skin that have broken skin, cuts, or scrapes. Scrub your back and under your arms. Make sure to wash skin folds. Let the lather sit on your skin for 1-2 minutes or as long as told by your health care provider. Using a different clean, wet washcloth, thoroughly rinse your entire body. Make sure that all body creases and crevices are rinsed well. Dry off with a clean towel. Do not put any substances on your body afterward--such as powder, lotion, or perfume--unless you are told to do so by your health care provider. Only use lotions that are recommended by the manufacturer. Put on clean clothes or pajamas. If it is the night before your surgery, sleep in clean sheets. How to use CHG prepackaged cloths Only use CHG cloths as told by your health care provider, and follow the instructions on the label. Use the CHG cloth on clean, dry skin. Do not use the CHG cloth on your head or face unless your health care provider tells you to. When washing with the CHG cloth: Avoid your genital  area. Avoid any areas of skin that have broken skin, cuts, or scrapes. Before surgery Follow these steps when using a  CHG cloth to clean before surgery (unless your health care provider gives you different instructions): Using the CHG cloth, vigorously scrub the part of your body where you will be having surgery. Scrub using a back-and-forth motion for 3 minutes. The area on your body should be completely wet with CHG when you are done scrubbing. Do not rinse. Discard the cloth and let the area air-dry. Do not put any substances on the area afterward, such as powder, lotion, or perfume. Put on clean clothes or pajamas. If it is the night before your surgery, sleep in clean sheets.  For general bathing Follow these steps when using CHG cloths for general bathing (unless your health care provider gives you different instructions). Use a separate CHG cloth for each area of your body. Make sure you wash between any folds of skin and between your fingers and toes. Wash your body in the following order, switching to a new cloth after each step: The front of your neck, shoulders, and chest. Both of your arms, under your arms, and your hands. Your stomach and groin area, avoiding the genitals. Your right leg and foot. Your left leg and foot. The back of your neck, your back, and your buttocks. Do not rinse. Discard the cloth and let the area air-dry. Do not put any substances on your body afterward--such as powder, lotion, or perfume--unless you are told to do so by your health care provider. Only use lotions that are recommended by the manufacturer. Put on clean clothes or pajamas. Contact a health care provider if: Your skin gets irritated after scrubbing. You have questions about using your solution or cloth. You swallow any chlorhexidine. Call your local poison control center (1-587 390 4405 in the [place] Get help right away if: Your eyes itch badly, or they become very red or swollen. Your skin itches badly and is red or swollen. Your hearing changes. You have trouble seeing. You have swelling or tingling in  your mouth or throat. You have trouble breathing. These symptoms may represent a serious problem that is an emergency. Do not wait to see if the symptoms will go away. Get medical help right away. Call your local emergency services (911 in the [place] Do not drive yourself to the hospital. Summary Chlorhexidine gluconate (CHG) is a germ-killing (antiseptic) solution that is used to clean the skin. Cleaning your skin with CHG may help to lower your risk for infection. You may be given CHG to use for bathing. It may be in a bottle or in a prepackaged cloth to use on your skin. Carefully follow your health care provider's instructions and the instructions on the product label. Do not use CHG if you have a chlorhexidine allergy. Contact your health care provider if your skin gets irritated after scrubbing. This information is not intended to replace advice given to you by your health care provider. Make sure you discuss any questions you have with your health care provider. Document Revised: 07/30/2020 Document Reviewed: 07/30/2020 Elsevier Patient Education  2022 [employer].

## 2021-06-06 ENCOUNTER — Encounter (HOSPITAL_COMMUNITY): Payer: Self-pay

## 2021-06-06 ENCOUNTER — Encounter (HOSPITAL_COMMUNITY)
Admission: RE | Admit: 2021-06-06 | Discharge: 2021-06-06 | Disposition: A | Discharge status: Home / Self Care | Payer: Medicare Other | Source: Ambulatory Visit | Attending: General Surgery | Admitting: General Surgery

## 2021-06-06 DIAGNOSIS — Z01810 Encounter for preprocedural cardiovascular examination: Secondary | ICD-10-CM | POA: Diagnosis not present

## 2021-06-06 HISTORY — DX: Unspecified osteoarthritis, unspecified site: M19.90

## 2021-06-06 HISTORY — DX: Gastro-esophageal reflux disease without esophagitis: K21.9

## 2021-06-10 ENCOUNTER — Encounter (HOSPITAL_COMMUNITY)
Admission: RE | Disposition: A | Discharge status: Home / Self Care | Payer: Self-pay | Source: Ambulatory Visit | Attending: General Surgery

## 2021-06-10 ENCOUNTER — Encounter (HOSPITAL_COMMUNITY): Payer: Self-pay | Admitting: General Surgery

## 2021-06-10 ENCOUNTER — Ambulatory Visit (HOSPITAL_COMMUNITY): Payer: Medicare Other | Admitting: Certified Registered Nurse Anesthetist

## 2021-06-10 ENCOUNTER — Other Ambulatory Visit: Payer: Self-pay

## 2021-06-10 ENCOUNTER — Ambulatory Visit (HOSPITAL_COMMUNITY)
Admission: RE | Admit: 2021-06-10 | Discharge: 2021-06-10 | Disposition: A | Discharge status: Home / Self Care | Payer: Medicare Other | Source: Ambulatory Visit | Attending: General Surgery | Admitting: General Surgery

## 2021-06-10 DIAGNOSIS — R519 Headache, unspecified: Secondary | ICD-10-CM | POA: Insufficient documentation

## 2021-06-10 DIAGNOSIS — A0472 Enterocolitis due to Clostridium difficile, not specified as recurrent: Secondary | ICD-10-CM | POA: Diagnosis not present

## 2021-06-10 DIAGNOSIS — K436 Other and unspecified ventral hernia with obstruction, without gangrene: Secondary | ICD-10-CM | POA: Insufficient documentation

## 2021-06-10 DIAGNOSIS — K420 Umbilical hernia with obstruction, without gangrene: Secondary | ICD-10-CM | POA: Diagnosis not present

## 2021-06-10 DIAGNOSIS — D649 Anemia, unspecified: Secondary | ICD-10-CM | POA: Diagnosis not present

## 2021-06-10 DIAGNOSIS — F32A Depression, unspecified: Secondary | ICD-10-CM | POA: Insufficient documentation

## 2021-06-10 DIAGNOSIS — Z87891 Personal history of nicotine dependence: Secondary | ICD-10-CM | POA: Insufficient documentation

## 2021-06-10 DIAGNOSIS — K219 Gastro-esophageal reflux disease without esophagitis: Secondary | ICD-10-CM | POA: Diagnosis not present

## 2021-06-10 HISTORY — PX: UMBILICAL HERNIA REPAIR: SHX196

## 2021-06-10 SURGERY — REPAIR, HERNIA, UMBILICAL, ADULT
Anesthesia: General | Site: Abdomen

## 2021-06-10 MED ORDER — SODIUM CHLORIDE 0.9 % IR SOLN
Status: DC | PRN
  Administered 2021-06-10: 1000 mL

## 2021-06-10 MED ORDER — PROPOFOL 10 MG/ML IV BOLUS
INTRAVENOUS | Status: DC | PRN
  Administered 2021-06-10: 120 mg via INTRAVENOUS
  Administered 2021-06-10: 60 mg via INTRAVENOUS

## 2021-06-10 MED ORDER — LIDOCAINE HCL (PF) 2 % IJ SOLN
INTRAMUSCULAR | Status: AC
  Filled 2021-06-10: qty 5

## 2021-06-10 MED ORDER — ONDANSETRON HCL 4 MG/2ML IJ SOLN: 4.0000 mg | Freq: Once | INTRAMUSCULAR | Status: DC | PRN

## 2021-06-10 MED ORDER — ETOMIDATE 2 MG/ML IV SOLN
INTRAVENOUS | Status: AC
  Filled 2021-06-10: qty 10

## 2021-06-10 MED ORDER — ONDANSETRON HCL 4 MG PO TABS: 4.0000 mg | ORAL_TABLET | Freq: Three times a day (TID) | ORAL | 1 refills | Status: DC | PRN

## 2021-06-10 MED ORDER — FENTANYL CITRATE (PF) 100 MCG/2ML IJ SOLN
INTRAMUSCULAR | Status: DC | PRN
  Administered 2021-06-10 (×3): 50 ug via INTRAVENOUS

## 2021-06-10 MED ORDER — FENTANYL CITRATE PF 50 MCG/ML IJ SOSY
25.0000 ug | PREFILLED_SYRINGE | INTRAMUSCULAR | Status: DC | PRN
  Administered 2021-06-10: 50 ug via INTRAVENOUS
  Filled 2021-06-10: qty 1

## 2021-06-10 MED ORDER — CHLORHEXIDINE GLUCONATE CLOTH 2 % EX PADS: 6.0000 | MEDICATED_PAD | Freq: Once | CUTANEOUS | Status: DC

## 2021-06-10 MED ORDER — DEXAMETHASONE SODIUM PHOSPHATE 10 MG/ML IJ SOLN
INTRAMUSCULAR | Status: AC
  Filled 2021-06-10: qty 1

## 2021-06-10 MED ORDER — LIDOCAINE HCL (CARDIAC) PF 100 MG/5ML IV SOSY
PREFILLED_SYRINGE | INTRAVENOUS | Status: DC | PRN
  Administered 2021-06-10: 60 mg via INTRATRACHEAL

## 2021-06-10 MED ORDER — PROPOFOL 10 MG/ML IV BOLUS
INTRAVENOUS | Status: AC
  Filled 2021-06-10: qty 20

## 2021-06-10 MED ORDER — ROCURONIUM BROMIDE 10 MG/ML (PF) SYRINGE
PREFILLED_SYRINGE | INTRAVENOUS | Status: AC
  Filled 2021-06-10: qty 10

## 2021-06-10 MED ORDER — SUGAMMADEX SODIUM 200 MG/2ML IV SOLN
INTRAVENOUS | Status: DC | PRN
  Administered 2021-06-10: 283.2 mg via INTRAVENOUS

## 2021-06-10 MED ORDER — ONDANSETRON HCL 4 MG/2ML IJ SOLN
INTRAMUSCULAR | Status: AC
  Filled 2021-06-10: qty 2

## 2021-06-10 MED ORDER — BUPIVACAINE LIPOSOME 1.3 % IJ SUSP
INTRAMUSCULAR | Status: AC
  Filled 2021-06-10: qty 20

## 2021-06-10 MED ORDER — ORAL CARE MOUTH RINSE: 15.0000 mL | Freq: Once | OROMUCOSAL | Status: AC

## 2021-06-10 MED ORDER — TRAMADOL HCL 50 MG PO TABS: 50.0000 mg | ORAL_TABLET | Freq: Four times a day (QID) | ORAL | 0 refills | Status: DC | PRN

## 2021-06-10 MED ORDER — ROCURONIUM BROMIDE 100 MG/10ML IV SOLN
INTRAVENOUS | Status: DC | PRN
Start: 2021-06-10 — End: 2021-06-10
  Administered 2021-06-10: 50 mg via INTRAVENOUS

## 2021-06-10 MED ORDER — SEVOFLURANE IN SOLN
RESPIRATORY_TRACT | Status: AC
  Filled 2021-06-10: qty 250

## 2021-06-10 MED ORDER — CEFAZOLIN SODIUM-DEXTROSE 2-4 GM/100ML-% IV SOLN
2.0000 g | INTRAVENOUS | Status: AC
  Administered 2021-06-10: 2 g via INTRAVENOUS

## 2021-06-10 MED ORDER — ONDANSETRON HCL 4 MG/2ML IJ SOLN
INTRAMUSCULAR | Status: DC | PRN
  Administered 2021-06-10: 4 mg via INTRAVENOUS

## 2021-06-10 MED ORDER — FENTANYL CITRATE (PF) 100 MCG/2ML IJ SOLN
INTRAMUSCULAR | Status: AC
  Filled 2021-06-10: qty 2

## 2021-06-10 MED ORDER — BUPIVACAINE LIPOSOME 1.3 % IJ SUSP
INTRAMUSCULAR | Status: DC | PRN
  Administered 2021-06-10: 20 mL

## 2021-06-10 MED ORDER — LACTATED RINGERS IV SOLN: INTRAVENOUS | Status: DC

## 2021-06-10 MED ORDER — CHLORHEXIDINE GLUCONATE 0.12 % MT SOLN
15.0000 mL | Freq: Once | OROMUCOSAL | Status: AC
  Administered 2021-06-10: 15 mL via OROMUCOSAL

## 2021-06-10 SURGICAL SUPPLY — 37 items
ADH SKN CLS APL DERMABOND .7 (GAUZE/BANDAGES/DRESSINGS) ×1
APL PRP STRL LF DISP 70% ISPRP (MISCELLANEOUS) ×1
BLADE SURG 15 STRL LF DISP TIS (BLADE) ×1 IMPLANT
BLADE SURG 15 STRL SS (BLADE) ×2
CHLORAPREP W/TINT 26 (MISCELLANEOUS) ×2 IMPLANT
CLOTH BEACON ORANGE TIMEOUT ST (SAFETY) ×2 IMPLANT
COVER LIGHT HANDLE STERIS (MISCELLANEOUS) ×4 IMPLANT
DERMABOND ADVANCED (GAUZE/BANDAGES/DRESSINGS) ×1
DERMABOND ADVANCED .7 DNX12 (GAUZE/BANDAGES/DRESSINGS) ×1 IMPLANT
ELECT REM PT RETURN 9FT ADLT (ELECTROSURGICAL) ×2
ELECTRODE REM PT RTRN 9FT ADLT (ELECTROSURGICAL) ×1 IMPLANT
GAUZE 4X4 16PLY ~~LOC~~+RFID DBL (SPONGE) ×2 IMPLANT
GLOVE SURG LTX SZ6.5 (GLOVE) ×1 IMPLANT
GLOVE SURG POLYISO LF SZ6.5 (GLOVE) ×1 IMPLANT
GLOVE SURG POLYISO LF SZ7 (GLOVE) ×2 IMPLANT
GLOVE SURG UNDER POLY LF SZ6.5 (GLOVE) ×1 IMPLANT
GLOVE SURG UNDER POLY LF SZ7 (GLOVE) ×5 IMPLANT
GLOVE SURG UNDER POLY LF SZ7.5 (GLOVE) ×1 IMPLANT
GOWN STRL REUS W/TWL LRG LVL3 (GOWN DISPOSABLE) ×6 IMPLANT
INST SET MINOR GENERAL (KITS) ×2 IMPLANT
KIT TURNOVER KIT A (KITS) ×2 IMPLANT
LIGASURE IMPACT 36 18CM CVD LR (INSTRUMENTS) ×1 IMPLANT
MANIFOLD NEPTUNE II (INSTRUMENTS) ×2 IMPLANT
MESH VENTRALEX ST 1-7/10 CRC S (Mesh General) ×1 IMPLANT
NDL HYPO 18GX1.5 BLUNT FILL (NEEDLE) ×1 IMPLANT
NDL HYPO 21X1.5 SAFETY (NEEDLE) ×1 IMPLANT
NEEDLE HYPO 18GX1.5 BLUNT FILL (NEEDLE) ×2 IMPLANT
NEEDLE HYPO 21X1.5 SAFETY (NEEDLE) ×2 IMPLANT
NS IRRIG 1000ML POUR BTL (IV SOLUTION) ×2 IMPLANT
PACK MINOR (CUSTOM PROCEDURE TRAY) ×2 IMPLANT
PAD ARMBOARD 7.5X6 YLW CONV (MISCELLANEOUS) ×2 IMPLANT
SET BASIN LINEN APH (SET/KITS/TRAYS/PACK) ×2 IMPLANT
SUT ETHIBOND NAB MO 7 #0 18IN (SUTURE) ×2 IMPLANT
SUT MNCRL AB 4-0 PS2 18 (SUTURE) ×2 IMPLANT
SUT VIC AB 3-0 SH 27 (SUTURE) ×2
SUT VIC AB 3-0 SH 27X BRD (SUTURE) ×1 IMPLANT
SYR 20ML LL LF (SYRINGE) ×4 IMPLANT

## 2021-06-10 NOTE — Discharge Instructions (Signed)
Discharge Instructions Hernia:  Common Complaints: Pain at the incision site is common. This will improve with time. Take your pain medications as described below. Some nausea is common and poor appetite. The main goal is to stay hydrated the first few days after surgery.  Bruising is common at the site.   Diet/ Activity: Diet as tolerated. You may not have an appetite, but it is important to stay hydrated.  Drink 64 ounces of water a day. Your appetite will return with time.  Shower per your regular routine daily.  Do not take hot showers. Take warm showers that are less than 10 minutes. Rest and listen to your body, but do not remain in bed all day.Walk everyday for at least 15-20 minutes.  Deep cough and move around every 1-2 hours in the first few days after surgery. Do not pick at the dermabond glue on your incision sites.  This glue film will remain in place for 1-2 weeks and will start to peel off. Do not place lotions or balms on your incision unless instructed to specifically by Dr. Constance Haw. Do not lift > 10 lbs, perform excessive bending, pushing, pulling, squatting for 6-8 weeks after surgery. Where your abdominal binder with activity as much as possible. The activity restrictions and the abdominal binder are to prevent hernia formation at your incision while you are healing.   Pain Expectations and Narcotics: -After surgery you will have pain associated with your incisions and this is normal. The pain is muscular and nerve pain, and will get better with time. -You are encouraged and expected to take non narcotic medications like tylenol and ibuprofen (when able) to treat pain as multiple modalities can aid with pain treatment. -Narcotics are only used when pain is severe or there is breakthrough pain. -You are not expected to have a pain score of 0 after surgery, as we cannot prevent pain. A pain score of 3-4 that allows you to be functional, move, walk, and tolerate some activity  is the goal. The pain will continue to improve over the days after surgery and is dependent on your surgery. -Due to [place] law, we are only able to give a certain amount of pain medication to treat post operative pain, and we only give additional narcotics on a patient by patient basis.  -For most laparoscopic surgery, studies have shown that the majority of patients only need 10-15 narcotic pills, and for open surgeries most patients only need 15-20.   -Having appropriate expectations of pain and knowledge of pain management with non narcotics is important as we do not want anyone to become addicted to narcotic pain medication.  -Using ice packs in the first 48 hours and heating pads after 48 hours, wearing an abdominal binder (when recommended), and using over the counter medications are all ways to help with pain management.   -Simple acts like meditation and mindfulness practices after surgery can also help with pain control and research has proven the benefit of these practices.  Medication: Take tylenol and ibuprofen as needed for pain control, alternating every 4-6 hours.  Example:  Tylenol 1000mg  @ 6am, 12noon, 6pm, 31midnight (Do not exceed 4000mg  of tylenol a day). Ibuprofen 800mg  @ 9am, 3pm, 9pm, 3am (Do not exceed 3600mg  of ibuprofen a day).  Take Tramadol for breakthrough pain every 4 hours.  Take Colace for constipation related to narcotic pain medication. If you do not have a bowel movement in 2 days, take Miralax over the counter.  Drink plenty  of water to also prevent constipation.   Contact Information: If you have questions or concerns, please call our office, [phone], Monday- Thursday 8AM-5PM and Friday 8AM-12Noon.  If it is after hours or on the weekend, please call [facility], [phone], [phone], and ask to speak to the surgeon on call for Dr. Constance Haw at [facility].

## 2021-06-10 NOTE — Op Note (Signed)
[facility] Operative Note  06/10/21  Preoperative Diagnosis: Supraumbilical hernia, incarcerated     Postoperative Diagnosis: Supraumbilical hernia, fascia defect size 1.5cm with incarcerated omentum    Procedure(s) Performed: Supraumbilical hernia repair with mesh, Ventralex ST 4.3cm patch   Surgeon: Lanell Matar. Constance Haw, MD   Assistants: No qualified resident was available    Anesthesia: General endotracheal   Anesthesiologist: Dr. Briant Cedar, MD    Specimens: None    Estimated Blood Loss: Minimal   Blood Replacement: None    Complications: None   Wound Class: Clean   Operative Indications: [name] is a very sweet 76 yo who has had a supraumbilical hernia for years but it is starting to get larger and cause her discomfort. We discussed repair and risk of bleeding, infection, injury to bowel, use of mesh versus primary repair.   Findings: 1.5cm fascia defect, incarcerated omentum    Procedure: The patient was taken to the operating room and placed supine. General endotracheal anesthesia was induced. Intravenous antibiotics were  administered per protocol.  The abdomen was prepared and draped in the usual sterile fashion.   The supraumbilical hernia (just above the umbilicus) was noted to be incarcerated and I could not appreciate the fascial defect size. A vertical incision was made above the umbilicus, and carried down through the subcutaneous tissue with electrocautery.  Dissection was performed down to the level of the fascia, exposing the hernia sac.  The hernia sac was opened with care, and excess hernia sac and incarcerated omentum was resected with electrocautery and the ligasure.  A finger was ran on the underlying peritoneum and this was clear.  The hernia fascial defect was 1.5cm in size. A 4.3 cm Ventralex St Hernia Patch was placed and secured with 0 Ethibond sutures ensuring that it was against the peritoneal cavity.  The hernia defect was then closed  with 0 Ethibond suture in an interrupted fashion over the patch in a vest over pants fashion.  The umbilical salk had not been disrupted. The cavity was closed with interrupted 3-0 Vicryl suture.   Hemostasis was confirmed. The skin was closed with a running 4-0 Monocryl suture and dermabond.  After the dermabond dried a 2X2 and tegaderm were placed over the umbilicus to act as a pressure dressing.     All counts were correct at the end of the case. The patient was awakened from anesthesia and extubated without complication.  The patient went to the PACU in stable condition.  Curlene Labrum, MD [facility] [address] [place], [place] [address] [phone] (office)

## 2021-06-10 NOTE — Anesthesia Preprocedure Evaluation (Signed)
Anesthesia Evaluation  Patient identified by MRN, date of birth, ID band Patient awake    Reviewed: Allergy & Precautions, H&P , NPO status , Patient's Chart, lab work & pertinent test results, reviewed documented beta blocker date and time   Airway Mallampati: II  TM Distance: >3 FB Neck ROM: full    Dental no notable dental hx.    Pulmonary neg pulmonary ROS, former smoker,    Pulmonary exam normal breath sounds clear to auscultation       Cardiovascular Exercise Tolerance: Good negative cardio ROS   Rhythm:regular Rate:Normal     Neuro/Psych  Headaches, PSYCHIATRIC DISORDERS Depression    GI/Hepatic Neg liver ROS, GERD  Medicated,  Endo/Other  negative endocrine ROS  Renal/GU negative Renal ROS  negative genitourinary   Musculoskeletal   Abdominal   Peds  Hematology  (+) Blood dyscrasia, anemia ,   Anesthesia Other Findings   Reproductive/Obstetrics negative OB ROS                             Anesthesia Physical Anesthesia Plan  ASA: 2  Anesthesia Plan: General and General ETT   Post-op Pain Management:    Induction:   PONV Risk Score and Plan: Ondansetron  Airway Management Planned:   Additional Equipment:   Intra-op Plan:   Post-operative Plan:   Informed Consent: I have reviewed the patients History and Physical, chart, labs and discussed the procedure including the risks, benefits and alternatives for the proposed anesthesia with the patient or authorized representative who has indicated his/her understanding and acceptance.     Dental Advisory Given  Plan Discussed with: CRNA  Anesthesia Plan Comments:         Anesthesia Quick Evaluation

## 2021-06-10 NOTE — Anesthesia Procedure Notes (Signed)
Procedure Name: Intubation Date/Time: 06/10/2021 7:35 AM Performed by: Minerva Ends, CRNA Pre-anesthesia Checklist: Patient identified, Emergency Drugs available, Suction available and Patient being monitored Patient Re-evaluated:Patient Re-evaluated prior to induction Oxygen Delivery Method: Circle system utilized Preoxygenation: Pre-oxygenation with 100% oxygen Induction Type: IV induction Ventilation: Mask ventilation without difficulty Grade View: Grade I Tube type: Oral Tube size: 7.0 mm Number of attempts: 1 Airway Equipment and Method: Stylet and Oral airway Placement Confirmation: ETT inserted through vocal cords under direct vision, positive ETCO2 and breath sounds checked- equal and bilateral Secured at: 21 cm Tube secured with: Tape Dental Injury: Teeth and Oropharynx as per pre-operative assessment

## 2021-06-10 NOTE — Progress Notes (Signed)
[facility]  Updated daughter. She asked that Rx go to [place], sent in. Will see 2/7 for  follow up. Binder.   Curlene Labrum, MD [facility] [address] [place], [place] [address] [phone] (office)

## 2021-06-10 NOTE — Interval H&P Note (Signed)
History and Physical Interval Note:  06/10/2021 7:24 AM  Kathi Ludwig  has presented today for surgery, with the diagnosis of Umbilical hernia.  The various methods of treatment have been discussed with the patient and family. After consideration of risks, benefits and other options for treatment, the patient has consented to  Procedure(s): HERNIA REPAIR UMBILICAL ADULT WITH MESH (N/A) as a surgical intervention.  The patient's history has been reviewed, patient examined, no change in status, stable for surgery.  I have reviewed the patient's chart and labs.  Questions were answered to the patient's satisfaction.     Virl Cagey

## 2021-06-10 NOTE — Anesthesia Postprocedure Evaluation (Signed)
Anesthesia Post Note  Patient: Jocelyn Love  Procedure(s) Performed: HERNIA REPAIR UMBILICAL ADULT WITH MESH (Abdomen)  Patient location during evaluation: Phase II Anesthesia Type: General Level of consciousness: awake Pain management: pain level controlled Vital Signs Assessment: post-procedure vital signs reviewed and stable Respiratory status: spontaneous breathing and respiratory function stable Cardiovascular status: blood pressure returned to baseline and stable Postop Assessment: no headache and no apparent nausea or vomiting Anesthetic complications: no Comments: Late entry   No notable events documented.   Last Vitals:  Vitals:   06/10/21 0900 06/10/21 0918  BP: (!) 126/97 (!) 157/88  Pulse: 84 73  Resp: 12 16  Temp:  36.7 C  SpO2: (!) 84% 99%    Last Pain:  Vitals:   06/10/21 0918  TempSrc: Oral  PainSc: [place]

## 2021-06-10 NOTE — Transfer of Care (Signed)
Immediate Anesthesia Transfer of Care Note  Patient: Jocelyn Love  Procedure(s) Performed: HERNIA REPAIR UMBILICAL ADULT WITH MESH (Abdomen)  Patient Location: PACU  Anesthesia Type:General  Level of Consciousness: awake, alert  and oriented  Airway & Oxygen Therapy: Patient Spontanous Breathing  Post-op Assessment: Report given to RN and Post -op Vital signs reviewed and stable  Post vital signs: Reviewed and stable  Last Vitals:  Vitals Value Taken Time  BP 169/93 06/10/21 0832  Temp 36.7 C 06/10/21 0832  Pulse 73 06/10/21 0838  Resp 13 06/10/21 0838  SpO2 99 % 06/10/21 0838  Vitals shown include unvalidated device data.  Last Pain:  Vitals:   06/10/21 0646  TempSrc: Oral  PainSc: 0-No pain         Complications: No notable events documented.

## 2021-06-11 ENCOUNTER — Encounter (HOSPITAL_COMMUNITY): Payer: Self-pay | Admitting: General Surgery

## 2021-07-09 ENCOUNTER — Ambulatory Visit (INDEPENDENT_AMBULATORY_CARE_PROVIDER_SITE_OTHER): Payer: Medicare Other | Admitting: General Surgery

## 2021-07-09 ENCOUNTER — Other Ambulatory Visit: Payer: Self-pay

## 2021-07-09 ENCOUNTER — Encounter: Payer: Self-pay | Admitting: General Surgery

## 2021-07-09 VITALS — BP 164/99 | HR 72 | Temp 97.4°F | Resp 20 | Ht 62.0 in | Wt 158.0 lb

## 2021-07-09 DIAGNOSIS — K420 Umbilical hernia with obstruction, without gangrene: Secondary | ICD-10-CM

## 2021-07-09 NOTE — Progress Notes (Signed)
[facility]  Doing well. Minimal soreness. Some induration.   BP (!) 164/99    Pulse 72    Temp (!) 97.4 F (36.3 C) (Oral)    Resp 20    Ht 5\' 2"  (1.575 m)    Wt 158 lb (71.7 kg)    SpO2 98%    BMI 28.90 kg/m  Healing, no erythema or drainage, induration, no signs of hernia  Patient s/p umbilical hernia s/p mesh repair. Doing well.  No heavy lifting > 10 lbs, excessive bending, pushing, pulling, or squatting for 6-8 weeks after surgery.    Curlene Labrum, MD [facility] [address] [place], [place] [address] [phone] (office)

## 2021-07-09 NOTE — Patient Instructions (Signed)
No heavy lifting > 10 lbs, excessive bending, pushing, pulling, or squatting for 6-8 weeks after surgery.   

## 2021-07-22 DIAGNOSIS — I10 Essential (primary) hypertension: Secondary | ICD-10-CM | POA: Diagnosis not present

## 2021-07-22 DIAGNOSIS — D464 Refractory anemia, unspecified: Secondary | ICD-10-CM | POA: Diagnosis not present

## 2021-07-22 DIAGNOSIS — E782 Mixed hyperlipidemia: Secondary | ICD-10-CM | POA: Diagnosis not present

## 2021-07-22 DIAGNOSIS — K146 Glossodynia: Secondary | ICD-10-CM | POA: Diagnosis not present

## 2021-07-22 DIAGNOSIS — R232 Flushing: Secondary | ICD-10-CM | POA: Diagnosis not present

## 2021-07-22 DIAGNOSIS — Z0001 Encounter for general adult medical examination with abnormal findings: Secondary | ICD-10-CM | POA: Diagnosis not present

## 2021-07-22 DIAGNOSIS — R252 Cramp and spasm: Secondary | ICD-10-CM | POA: Diagnosis not present

## 2021-07-22 DIAGNOSIS — M1991 Primary osteoarthritis, unspecified site: Secondary | ICD-10-CM | POA: Diagnosis not present

## 2021-07-30 DIAGNOSIS — E782 Mixed hyperlipidemia: Secondary | ICD-10-CM | POA: Diagnosis not present

## 2021-08-02 ENCOUNTER — Other Ambulatory Visit: Payer: Self-pay | Admitting: *Deleted

## 2021-08-02 DIAGNOSIS — D509 Iron deficiency anemia, unspecified: Secondary | ICD-10-CM

## 2021-08-02 DIAGNOSIS — D649 Anemia, unspecified: Secondary | ICD-10-CM

## 2021-08-30 DIAGNOSIS — K1329 Other disturbances of oral epithelium, including tongue: Secondary | ICD-10-CM | POA: Diagnosis not present

## 2021-08-30 DIAGNOSIS — N390 Urinary tract infection, site not specified: Secondary | ICD-10-CM | POA: Diagnosis not present

## 2021-08-30 DIAGNOSIS — E782 Mixed hyperlipidemia: Secondary | ICD-10-CM | POA: Diagnosis not present

## 2021-09-09 ENCOUNTER — Encounter: Payer: Self-pay | Admitting: Internal Medicine

## 2021-09-26 DIAGNOSIS — D649 Anemia, unspecified: Secondary | ICD-10-CM | POA: Diagnosis not present

## 2021-09-26 DIAGNOSIS — D509 Iron deficiency anemia, unspecified: Secondary | ICD-10-CM | POA: Diagnosis not present

## 2021-09-27 LAB — CBC WITH DIFFERENTIAL/PLATELET
Absolute Monocytes: 573 cells/uL (ref 200–950)
Basophils Absolute: 91 cells/uL (ref 0–200)
Basophils Relative: 1.1 %
Eosinophils Absolute: 216 cells/uL (ref 15–500)
Eosinophils Relative: 2.6 %
HCT: 38.8 % (ref 35.0–45.0)
Hemoglobin: 12.8 g/dL (ref 11.7–15.5)
Lymphs Abs: 3735 cells/uL (ref 850–3900)
MCH: 30.5 pg (ref 27.0–33.0)
MCHC: 33 g/dL (ref 32.0–36.0)
MCV: 92.6 fL (ref 80.0–100.0)
MPV: 10.4 fL (ref 7.5–12.5)
Monocytes Relative: 6.9 %
Neutro Abs: 3685 cells/uL (ref 1500–7800)
Neutrophils Relative %: 44.4 %
Platelets: 300 10*3/uL (ref 140–400)
RBC: 4.19 10*6/uL (ref 3.80–5.10)
RDW: 12.7 % (ref 11.0–15.0)
Total Lymphocyte: 45 %
WBC: 8.3 10*3/uL (ref 3.8–10.8)

## 2021-09-27 LAB — IRON,TIBC AND FERRITIN PANEL
%SAT: 24 % (calc) (ref 16–45)
Ferritin: 21 ng/mL (ref 16–288)
Iron: 100 ug/dL (ref 45–160)
TIBC: 410 mcg/dL (calc) (ref 250–450)

## 2021-09-29 DIAGNOSIS — E782 Mixed hyperlipidemia: Secondary | ICD-10-CM | POA: Diagnosis not present

## 2021-10-01 ENCOUNTER — Other Ambulatory Visit: Payer: Self-pay | Admitting: Gastroenterology

## 2021-10-01 NOTE — Telephone Encounter (Signed)
Last ov 05/09/2021 ?

## 2021-10-30 DIAGNOSIS — E782 Mixed hyperlipidemia: Secondary | ICD-10-CM | POA: Diagnosis not present

## 2021-11-01 DIAGNOSIS — H43813 Vitreous degeneration, bilateral: Secondary | ICD-10-CM | POA: Diagnosis not present

## 2021-11-05 DIAGNOSIS — D2262 Melanocytic nevi of left upper limb, including shoulder: Secondary | ICD-10-CM | POA: Diagnosis not present

## 2021-11-05 DIAGNOSIS — D2272 Melanocytic nevi of left lower limb, including hip: Secondary | ICD-10-CM | POA: Diagnosis not present

## 2021-11-05 DIAGNOSIS — D225 Melanocytic nevi of trunk: Secondary | ICD-10-CM | POA: Diagnosis not present

## 2021-11-05 DIAGNOSIS — D485 Neoplasm of uncertain behavior of skin: Secondary | ICD-10-CM | POA: Diagnosis not present

## 2021-11-05 DIAGNOSIS — Z1283 Encounter for screening for malignant neoplasm of skin: Secondary | ICD-10-CM | POA: Diagnosis not present

## 2021-11-20 DIAGNOSIS — D485 Neoplasm of uncertain behavior of skin: Secondary | ICD-10-CM | POA: Diagnosis not present

## 2021-11-20 DIAGNOSIS — L570 Actinic keratosis: Secondary | ICD-10-CM | POA: Diagnosis not present

## 2021-11-20 DIAGNOSIS — L988 Other specified disorders of the skin and subcutaneous tissue: Secondary | ICD-10-CM | POA: Diagnosis not present

## 2021-11-20 DIAGNOSIS — X32XXXD Exposure to sunlight, subsequent encounter: Secondary | ICD-10-CM | POA: Diagnosis not present

## 2022-04-28 ENCOUNTER — Other Ambulatory Visit: Payer: Self-pay | Admitting: Internal Medicine

## 2022-04-29 DIAGNOSIS — R252 Cramp and spasm: Secondary | ICD-10-CM | POA: Diagnosis not present

## 2022-04-29 DIAGNOSIS — E782 Mixed hyperlipidemia: Secondary | ICD-10-CM | POA: Diagnosis not present

## 2022-04-29 DIAGNOSIS — R7309 Other abnormal glucose: Secondary | ICD-10-CM | POA: Diagnosis not present

## 2022-04-29 DIAGNOSIS — Z0001 Encounter for general adult medical examination with abnormal findings: Secondary | ICD-10-CM | POA: Diagnosis not present

## 2022-04-29 DIAGNOSIS — K219 Gastro-esophageal reflux disease without esophagitis: Secondary | ICD-10-CM | POA: Diagnosis not present

## 2022-04-29 DIAGNOSIS — I10 Essential (primary) hypertension: Secondary | ICD-10-CM | POA: Diagnosis not present

## 2022-04-29 DIAGNOSIS — E559 Vitamin D deficiency, unspecified: Secondary | ICD-10-CM | POA: Diagnosis not present

## 2022-04-29 DIAGNOSIS — M1991 Primary osteoarthritis, unspecified site: Secondary | ICD-10-CM | POA: Diagnosis not present

## 2022-04-29 DIAGNOSIS — K1329 Other disturbances of oral epithelium, including tongue: Secondary | ICD-10-CM | POA: Diagnosis not present

## 2022-07-09 DIAGNOSIS — L308 Other specified dermatitis: Secondary | ICD-10-CM | POA: Diagnosis not present

## 2022-07-09 DIAGNOSIS — L820 Inflamed seborrheic keratosis: Secondary | ICD-10-CM | POA: Diagnosis not present

## 2022-07-09 DIAGNOSIS — Z1283 Encounter for screening for malignant neoplasm of skin: Secondary | ICD-10-CM | POA: Diagnosis not present

## 2022-07-09 DIAGNOSIS — R208 Other disturbances of skin sensation: Secondary | ICD-10-CM | POA: Diagnosis not present

## 2022-07-09 DIAGNOSIS — D225 Melanocytic nevi of trunk: Secondary | ICD-10-CM | POA: Diagnosis not present

## 2022-09-10 ENCOUNTER — Other Ambulatory Visit (HOSPITAL_COMMUNITY): Payer: Self-pay | Admitting: Internal Medicine

## 2022-09-10 DIAGNOSIS — R1013 Epigastric pain: Secondary | ICD-10-CM

## 2022-09-10 DIAGNOSIS — G9332 Myalgic encephalomyelitis/chronic fatigue syndrome: Secondary | ICD-10-CM | POA: Diagnosis not present

## 2022-09-10 DIAGNOSIS — M1991 Primary osteoarthritis, unspecified site: Secondary | ICD-10-CM | POA: Diagnosis not present

## 2022-09-10 DIAGNOSIS — E559 Vitamin D deficiency, unspecified: Secondary | ICD-10-CM | POA: Diagnosis not present

## 2022-09-10 DIAGNOSIS — E782 Mixed hyperlipidemia: Secondary | ICD-10-CM | POA: Diagnosis not present

## 2022-09-10 DIAGNOSIS — I10 Essential (primary) hypertension: Secondary | ICD-10-CM | POA: Diagnosis not present

## 2022-09-10 DIAGNOSIS — D518 Other vitamin B12 deficiency anemias: Secondary | ICD-10-CM | POA: Diagnosis not present

## 2022-09-10 DIAGNOSIS — Z0001 Encounter for general adult medical examination with abnormal findings: Secondary | ICD-10-CM | POA: Diagnosis not present

## 2022-09-10 DIAGNOSIS — R7309 Other abnormal glucose: Secondary | ICD-10-CM | POA: Diagnosis not present

## 2022-09-26 ENCOUNTER — Ambulatory Visit (HOSPITAL_COMMUNITY)
Admission: RE | Admit: 2022-09-26 | Discharge: 2022-09-26 | Disposition: A | Discharge status: Home / Self Care | Payer: Medicare Other | Source: Ambulatory Visit | Attending: Internal Medicine | Admitting: Internal Medicine

## 2022-09-26 DIAGNOSIS — K8020 Calculus of gallbladder without cholecystitis without obstruction: Secondary | ICD-10-CM | POA: Diagnosis not present

## 2022-09-26 DIAGNOSIS — R1013 Epigastric pain: Secondary | ICD-10-CM | POA: Insufficient documentation

## 2022-09-30 DIAGNOSIS — I10 Essential (primary) hypertension: Secondary | ICD-10-CM | POA: Diagnosis not present

## 2022-09-30 DIAGNOSIS — E782 Mixed hyperlipidemia: Secondary | ICD-10-CM | POA: Diagnosis not present

## 2022-10-17 ENCOUNTER — Other Ambulatory Visit: Payer: Self-pay | Admitting: *Deleted

## 2022-10-17 DIAGNOSIS — K8020 Calculus of gallbladder without cholecystitis without obstruction: Secondary | ICD-10-CM

## 2022-10-21 ENCOUNTER — Encounter: Payer: Self-pay | Admitting: General Surgery

## 2022-10-21 ENCOUNTER — Ambulatory Visit: Payer: Medicare Other | Admitting: General Surgery

## 2022-10-21 VITALS — BP 122/78 | HR 82 | Temp 98.1°F | Resp 14 | Ht 62.0 in | Wt 150.0 lb

## 2022-10-21 DIAGNOSIS — K8020 Calculus of gallbladder without cholecystitis without obstruction: Secondary | ICD-10-CM

## 2022-10-21 NOTE — Patient Instructions (Addendum)
Call if having issues and think your gallbladder is causing you pain. Pay attention to your diet and see if you have any pain, nausea/bloating.   Cholelithiasis  Cholelithiasis is a disease in which gallstones form in the gallbladder. The gallbladder is an organ that stores bile. Bile is a fluid that helps to digest fats. Gallstones begin as small crystals and can slowly grow into stones. They may cause no symptoms until they block the gallbladder duct, or cystic duct, when the gallbladder tightens, or contracts, after food is eaten. This can cause pain and is known as a gallbladder attack, or biliary colic. There are two main types of gallstones: Cholesterol stones. These are the most common type of gallstone. These stones are made of hardened cholesterol and are usually yellow-green in color. Pigment stones. These are dark in color and are made of a red-yellow substance, called bilirubin,that forms when hemoglobin from red blood cells breaks down. What are the causes? This condition may be caused by too little or too much of the substances that are in bile. This can happen if the bile: Has too much bilirubin. This can happen in certain blood diseases, such as sickle cell anemia. Has too much cholesterol. Does not have enough bile salts. These salts help the body absorb and digest fats. It can also happen if the gallbladder is not emptying completely. This is common during pregnancy. What increases the risk? The following factors may make you more likely to develop this condition: Being older than 77 years of age. Eating a diet that is heavy in fried foods, fat, and refined carbohydrates, such as white bread and white rice. Being female. Having multiple pregnancies. Using medicines that contain female hormones (estrogen) for a long time. Having certain medical problems, such as: Diabetes mellitus. Obesity. Cystic fibrosis. Crohn's disease. Cirrhosis or other long-term (chronic) liver  disease. Certain blood diseases, such as sickle cell anemia or leukemia. Having a family history of gallstones. Losing weight quickly. What are the signs or symptoms? In many cases, having gallstones causes no symptoms. These are called silent gallstones. If a gallstone blocks your bile duct, it can cause a gallbladder attack. The main symptom of a gallbladder attack is sudden pain in the upper right part of the abdomen. The pain: Usually comes at night or after eating. Can last for one hour or more. Can spread to your right shoulder, back, or chest. Can feel like indigestion. This is discomfort, burning, or fullness in your upper abdomen. If the bile duct is blocked for more than a few hours, it can cause an infection or inflammation of your gallbladder (cholecystitis), liver, or pancreas. This can cause: Nausea or vomiting. Bloating. Pain in your abdomen that lasts for 5 hours or longer. Tenderness in your upper abdomen, often in the upper right section and under your rib cage. Fever or chills. Skin or the white parts of your eyes turning yellow (jaundice). This usually happens when a stone has blocked bile from passing through the bile duct. Dark pee (urine) or light-colored poop (stools). How is this diagnosed? This condition may be diagnosed based on: A physical exam. Your medical history. Ultrasound. CT scan. MRI. You may also have other tests, including: Blood tests to check for infection or inflammation. The HIDA scan to see the gallbladder and the bile ducts. An endoscope to check for blockage in the bile ducts. How is this treated? Treatment depends on the severity of your symptoms. Silent gallstones do not need treatment. You  may need treatment if a blockage causes a gallbladder attack or other symptoms. Treatment may include: If symptoms are mild, you may care for yourself at home. For mild symptoms: Stop eating and drinking for 12-24 hours. You may drink water and clear  liquids. This helps to "cool down" your gallbladder. After 1 or 2 days, eat a diet of simple or clear foods, such as broths and crackers. Take medicines for pain or nausea. Take antibiotics if you have an infection. If symptoms are severe, you may: Stay in the hospital for pain control or to treat severe infection. Have surgery to remove the gallbladder (cholecystectomy). This is the most common treatment if all other treatments have not worked. Take medicines to break up gallstones. Medicines may be used for up to 6-12 months. Have an procedure to capture and remove gallstones. Follow these instructions at home: Medicines Take over-the-counter and prescription medicines only as told by your health care provider. If you were prescribed antibiotics, take them as told by your provider. Do not stop using the antibiotic even if you start to feel better. Ask your provider if the medicine prescribed to you requires you to avoid driving or using machinery. Eating and drinking Drink enough fluid to keep your pee pale yellow. This is important during a gallbladder attack. Water and clear liquids are preferred. Follow a healthy diet. This includes: Reducing fatty foods, such as fried food and foods high in cholesterol. Reducing refined carbohydrates, such as white bread and white rice. Eating more fiber. Aim for foods such as almonds, fruit, and beans. General instructions Do not use any products that contain nicotine or tobacco. These products include cigarettes, chewing tobacco, and vaping devices, such as e-cigarettes. If you need help quitting, ask your provider. Maintain a healthy weight. Keep all follow-up visits. These may include seeing a specialist or a Careers adviser. Where to find more information [employer] of Diabetes and Digestive and Kidney Diseases: [email or web address] Contact a health care provider if: You think you have had a gallbladder attack. You have been diagnosed with silent  gallstones and you develop indigestion or pain in your abdomen. You have pain from a gallbladder attack that lasts for more than 2 hours. You begin to have attacks more often. You have nausea. You have dark pee or light-colored poop. Get help right away if: You have pain in your abdomen that lasts for more than 5 hours or is getting worse. You have a fever or chills. You have vomiting that does not go away. You develop jaundice. This information is not intended to replace advice given to you by your health care provider. Make sure you discuss any questions you have with your health care provider. Document Revised: 03/03/2022 Document Reviewed: 03/03/2022 Elsevier Patient Education  2023 [employer]vier Inc.  Gallbladder Eating Plan High blood cholesterol, obesity, a sedentary lifestyle, an unhealthy diet, and diabetes are risk factors for developing gallstones. If you have a gallbladder condition, you may have trouble digesting fats and tolerating high fat intake. Eating a low-fat diet can help reduce your symptoms and may be helpful before and after having surgery to remove your gallbladder (cholecystectomy). Your health care provider may recommend that you work with a dietitian to help you reduce the amount of fat in your diet. What are tips for following this plan? General guidelines Limit your fat intake to less than 30% of your total daily calories. If you eat around 1,800 calories each day, this means eating less than 60 grams (g)  of fat per day. Fat is an important part of a healthy diet. Eating a low-fat diet can make it hard to maintain a healthy body weight. Ask your dietitian how much fat, calories, and other nutrients you need each day. Eat small, frequent meals throughout the day instead of three large meals. Drink at least 8-10 cups (1.9-2.4 L) of fluid a day. Drink enough fluid to keep your urine pale yellow. If you drink alcohol: Limit how much you have to: 0-1 drink a day for  women who are not pregnant. 0-2 drinks a day for men. Know how much alcohol is in a drink. In the [place] one drink equals one 12 oz bottle of beer (355 mL), one 5 oz glass of wine (148 mL), or one 1 oz glass of hard liquor (44 mL). Reading food labels  Check nutrition facts on food labels for the amount of fat per serving. Choose foods with less than 3 grams of fat per serving. Shopping Choose nonfat and low-fat healthy foods. Look for the words "nonfat," "low-fat," or "fat-free." Avoid buying processed or prepackaged foods. Cooking Cook using low-fat methods, such as baking, broiling, grilling, or boiling. Cook with small amounts of healthy fats, such as olive oil, grapeseed oil, canola oil, avocado oil, or sunflower oil. What foods are recommended?  All fresh, frozen, or canned fruits and vegetables. Whole grains. Low-fat or nonfat (skim) milk and yogurt. Lean meat, skinless poultry, fish, eggs, and beans. Low-fat protein supplement powders or drinks. Spices and herbs. The items listed above may not be a complete list of foods and beverages you can eat and drink. Contact a dietitian for more information. What foods are not recommended? High-fat foods. These include baked goods, fast food, fatty cuts of meat, ice cream, french toast, sweet rolls, pizza, cheese bread, foods covered with butter, creamy sauces, or cheese. Fried foods. These include french fries, tempura, battered fish, breaded chicken, fried breads, and sweets. Foods that cause bloating and gas. The items listed above may not be a complete list of foods that you should avoid. Contact a dietitian for more information. Summary A low-fat diet can be helpful if you have a gallbladder condition, or before and after gallbladder surgery. Limit your fat intake to less than 30% of your total daily calories. This is about 60 g of fat if you eat 1,800 calories each day. Eat small, frequent meals throughout the day instead of three  large meals. This information is not intended to replace advice given to you by your health care provider. Make sure you discuss any questions you have with your health care provider. Document Revised: 05/03/2021 Document Reviewed: 05/03/2021 Elsevier Patient Education  2023 [employer].

## 2022-10-22 NOTE — Progress Notes (Signed)
[facility] History and Physical  Reason for Referral: Gallstones  Referring Physician: Elfredia Nevins, MD   Chief Complaint   Follow-up     Jocelyn Love is a 77 y.o. female.  HPI: Patient known to me after a supraumbilical hernia repair. She reports she was sent back after finding she had gallstones on [place]. She says that she was having some right sided back/ flank pain and some nausea/bloating. She does not recall any major right sided abdominal pain but says she could have and she is just not remembering. She has not had any pain recently with food and says she is otherwise well. Her hernia site and incision area are pain free. She is here to discuss the gallstone finding on [place].   Past Medical History:  Diagnosis Date   Anemia    Arthritis    Chronic nausea    Clostridium difficile diarrhea 11/2016   treated with flagyl   Depression    GERD (gastroesophageal reflux disease)    Headache    HLD (hyperlipidemia)     Past Surgical History:  Procedure Laterality Date   ABDOMINAL HYSTERECTOMY     BIOPSY  01/20/2020   Procedure: BIOPSY;  Surgeon: Lanelle Bal, DO;  Location: [facility];  Service: Endoscopy;;  duodenal, antrum body   BIOPSY TONGUE     BLADDER SURGERY     BREAST SURGERY     augmentation   COLONOSCOPY N/A 11/26/2016   Procedure: COLONOSCOPY;  Surgeon: West Bali, MD;  10 mm cecal polyp removed piecemeal and treated with APC, 4 mm in the sigmoid colon, internal hemorrhoids.  Pathology with tubular adenoma in the cecum and hyperplastic polyp in the sigmoid.  Recommended repeat colonoscopy in 3 years.   COLONOSCOPY WITH PROPOFOL N/A 01/20/2020   Procedure: COLONOSCOPY WITH PROPOFOL;  Surgeon: Lanelle Bal, DO; Nonbleeding internal hemorrhoids, few small diverticula in sigmoid colon, normal examined ileum, random colon biopsies taken (benign).  Recommended repeat colonoscopy in 5 years.   ESOPHAGOGASTRODUODENOSCOPY N/A 11/26/2016    Procedure: ESOPHAGOGASTRODUODENOSCOPY (EGD);  Surgeon: West Bali, MD;  Nonobstructing Schatzki ring, mild gastritis, gastric antral angiectasia, angiectasia in the second portion of duodenum.  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  No H. pylori, benign small bowel biopsies,   ESOPHAGOGASTRODUODENOSCOPY N/A 12/23/2016   Procedure: ESOPHAGOGASTRODUODENOSCOPY (EGD);  Surgeon: West Bali, MD;  Moderate Schatzki ring, mild gastritis, moderate postulcer deformity in the duodenal bulb, Givens capsule released into the duodenum.   ESOPHAGOGASTRODUODENOSCOPY (EGD) WITH PROPOFOL N/A 01/20/2020   Procedure: ESOPHAGOGASTRODUODENOSCOPY (EGD) WITH PROPOFOL;  Surgeon: Lanelle Bal, DO; moderate Schatzki's ring s/p dilation, small hiatal hernia, diffuse inflammation in the entire stomach (gastritis, no H. pylori), normal examined duodenum s/p biopsy (benign).   GIVENS CAPSULE STUDY N/A 12/23/2016   Procedure: GIVENS CAPSULE STUDY;  Surgeon: West Bali, MD; small bowel AVMs   SAVORY DILATION N/A 11/26/2016   Procedure: SAVORY DILATION;  Surgeon: West Bali, MD;  Location: [facility];  Service: Endoscopy;  Laterality: N/A;   UMBILICAL HERNIA REPAIR N/A 06/10/2021   Procedure: HERNIA REPAIR UMBILICAL ADULT WITH MESH;  Surgeon: Jocelyn Roers, MD;  Location: [facility];  Service: General;  Laterality: N/A;    Family History  Problem Relation Age of Onset   Colon cancer Neg Hx     Social History   Tobacco Use   Smoking status: Former    Types: Cigarettes   Smokeless tobacco: Never  Vaping Use  Vaping Use: Never used  Substance Use Topics   Alcohol use: Yes    Comment: occ glass of wine   Drug use: No    Medications: I have reviewed the patient's current medications. Allergies as of 10/21/2022       Reactions   Codeine Itching, Rash   Sulfa Antibiotics Itching, Rash        Medication List        Accurate as of Oct 21, 2022 11:59 PM. If you have any questions, ask  your nurse or doctor.          STOP taking these medications    ibuprofen 200 MG tablet Commonly known as: ADVIL Stopped by: Jocelyn Roers, MD       TAKE these medications    acetaminophen 500 MG tablet Commonly known as: TYLENOL Take 1,000 mg by mouth every 6 (six) hours.   amLODipine 5 MG tablet Commonly known as: NORVASC Take 5 mg by mouth daily.   DULoxetine 30 MG capsule Commonly known as: CYMBALTA Take 30 mg by mouth daily.   gabapentin 300 MG capsule Commonly known as: NEURONTIN Take 300-600 mg by mouth See admin instructions. Take 600 mg in the morning and 300 mg in the evening   lipase/protease/amylase 16109 UNITS Cpep capsule Commonly known as: CREON Take 72,000 Units by mouth 3 (three) times daily with meals. 36000 with snack   omeprazole 40 MG capsule Commonly known as: PRILOSEC TAKE ONE CAPSULE BY MOUTH EVERY MORNING   rosuvastatin 10 MG tablet Commonly known as: CRESTOR Take 10 mg by mouth daily.   sucralfate 1 g tablet Commonly known as: CARAFATE Take 1 g by mouth 2 (two) times daily.         ROS:  A comprehensive review of systems was negative except for: Musculoskeletal: positive for back pain/ right back and shoulder area; chronic lower back pain   Blood pressure 122/78, pulse 82, temperature 98.1 F (36.7 C), temperature source Oral, resp. rate 14, height 5\' 2"  (1.575 m), weight 150 lb (68 kg), SpO2 95 %. Physical Exam HENT:     Head: Normocephalic.     Nose: Nose normal.  Eyes:     Extraocular Movements: Extraocular movements intact.  Cardiovascular:     Rate and Rhythm: Normal rate and regular rhythm.  Pulmonary:     Effort: Pulmonary effort is normal.     Breath sounds: Normal breath sounds.  Abdominal:     General: There is no distension.     Palpations: Abdomen is soft.     Tenderness: There is no abdominal tenderness.     Hernia: No hernia is present.  Musculoskeletal:        General: No swelling. Normal range of  motion.     Cervical back: Normal range of motion.  Skin:    General: Skin is warm.  Neurological:     General: No focal deficit present.     Mental Status: She is alert and oriented to person, place, and time.  Psychiatric:        Mood and Affect: Mood normal.        Behavior: Behavior normal.        Thought Content: Thought content normal.     Results: Personally reviewed and noted 2-3 stones about 1.3-1.5cm in size  CLINICAL DATA:  Intermittent epigastric pain.   EXAM: ABDOMEN ULTRASOUND COMPLETE   COMPARISON:  None Available.   FINDINGS: Gallbladder: Cholelithiasis. The gallbladder is otherwise normal. No Murphy's  sign.   Common bile duct: Diameter: 6 mm   Liver: No focal lesion identified. Within normal limits in parenchymal echogenicity. Portal vein is patent on color Doppler imaging with normal direction of blood flow towards the liver.   IVC: No abnormality visualized.   Pancreas: Visualized portion unremarkable.   Spleen: Size and appearance within normal limits.   Right Kidney: Length: 10.5 cm. Echogenicity within normal limits. No mass or hydronephrosis visualized.   Left Kidney: Length: 10.9 cm. Echogenicity within normal limits. No mass or hydronephrosis visualized.   Abdominal aorta: No aneurysm visualized.   Other findings: None.   IMPRESSION: 1. Cholelithiasis. The gallbladder is otherwise normal. 2. No other abnormalities.     Electronically Signed   By: Gerome Sam III M.D.   On: 09/27/2022 14:12   Assessment & Plan:  Jocelyn Love is a 77 y.o. female with gallstones. She is not having current symptoms. She is doing well after her hernia repair. We discussed her monitoring her food and seeing if she starts having any pain or issues. Discussed gallstones and risk of acute cholecystitis, less likely for her choledocholithiasis/pancreatitis given the stone size, and starting to have pain.   She is going to monitor her diet and  symptoms and call if she decides she is having issues with the gallbladder.   All questions were answered to the satisfaction of the patient.  Jocelyn Love 10/22/2022, 11:30 AM

## 2022-12-31 ENCOUNTER — Other Ambulatory Visit (HOSPITAL_COMMUNITY): Payer: Self-pay | Admitting: Internal Medicine

## 2022-12-31 DIAGNOSIS — I10 Essential (primary) hypertension: Secondary | ICD-10-CM | POA: Diagnosis not present

## 2022-12-31 DIAGNOSIS — R519 Headache, unspecified: Secondary | ICD-10-CM | POA: Diagnosis not present

## 2022-12-31 DIAGNOSIS — H8110 Benign paroxysmal vertigo, unspecified ear: Secondary | ICD-10-CM | POA: Diagnosis not present

## 2022-12-31 DIAGNOSIS — H9313 Tinnitus, bilateral: Secondary | ICD-10-CM | POA: Diagnosis not present

## 2023-01-09 ENCOUNTER — Ambulatory Visit (HOSPITAL_COMMUNITY)
Admission: RE | Admit: 2023-01-09 | Discharge: 2023-01-09 | Disposition: A | Discharge status: Home / Self Care | Payer: Medicare Other | Source: Ambulatory Visit | Attending: Internal Medicine | Admitting: Internal Medicine

## 2023-01-09 DIAGNOSIS — H8110 Benign paroxysmal vertigo, unspecified ear: Secondary | ICD-10-CM | POA: Insufficient documentation

## 2023-01-09 DIAGNOSIS — R519 Headache, unspecified: Secondary | ICD-10-CM | POA: Diagnosis not present

## 2023-01-09 DIAGNOSIS — R42 Dizziness and giddiness: Secondary | ICD-10-CM | POA: Diagnosis not present

## 2023-01-09 DIAGNOSIS — G8929 Other chronic pain: Secondary | ICD-10-CM | POA: Diagnosis not present

## 2023-02-17 DIAGNOSIS — I10 Essential (primary) hypertension: Secondary | ICD-10-CM | POA: Diagnosis not present

## 2023-04-02 DIAGNOSIS — E782 Mixed hyperlipidemia: Secondary | ICD-10-CM | POA: Diagnosis not present

## 2023-04-02 DIAGNOSIS — I10 Essential (primary) hypertension: Secondary | ICD-10-CM | POA: Diagnosis not present

## 2023-06-02 DIAGNOSIS — E782 Mixed hyperlipidemia: Secondary | ICD-10-CM | POA: Diagnosis not present

## 2023-06-02 DIAGNOSIS — I10 Essential (primary) hypertension: Secondary | ICD-10-CM | POA: Diagnosis not present

## 2023-06-06 DIAGNOSIS — H43813 Vitreous degeneration, bilateral: Secondary | ICD-10-CM | POA: Diagnosis not present

## 2023-06-19 ENCOUNTER — Other Ambulatory Visit (HOSPITAL_COMMUNITY): Payer: Self-pay | Admitting: Internal Medicine

## 2023-06-19 ENCOUNTER — Ambulatory Visit (HOSPITAL_COMMUNITY)
Admission: RE | Admit: 2023-06-19 | Discharge: 2023-06-19 | Disposition: A | Discharge status: Home / Self Care | Payer: Medicare Other | Source: Ambulatory Visit | Attending: Internal Medicine | Admitting: Internal Medicine

## 2023-06-19 DIAGNOSIS — Z9229 Personal history of other drug therapy: Secondary | ICD-10-CM | POA: Diagnosis not present

## 2023-06-19 DIAGNOSIS — M439 Deforming dorsopathy, unspecified: Secondary | ICD-10-CM | POA: Diagnosis not present

## 2023-06-19 DIAGNOSIS — H8110 Benign paroxysmal vertigo, unspecified ear: Secondary | ICD-10-CM | POA: Diagnosis not present

## 2023-06-19 DIAGNOSIS — M545 Low back pain, unspecified: Secondary | ICD-10-CM

## 2023-06-19 DIAGNOSIS — E559 Vitamin D deficiency, unspecified: Secondary | ICD-10-CM | POA: Diagnosis not present

## 2023-06-19 DIAGNOSIS — R7309 Other abnormal glucose: Secondary | ICD-10-CM | POA: Diagnosis not present

## 2023-06-19 DIAGNOSIS — R519 Headache, unspecified: Secondary | ICD-10-CM | POA: Diagnosis not present

## 2023-06-19 DIAGNOSIS — Z6821 Body mass index (BMI) 21.0-21.9, adult: Secondary | ICD-10-CM | POA: Diagnosis not present

## 2023-06-19 DIAGNOSIS — M1991 Primary osteoarthritis, unspecified site: Secondary | ICD-10-CM | POA: Diagnosis not present

## 2023-06-19 DIAGNOSIS — M47817 Spondylosis without myelopathy or radiculopathy, lumbosacral region: Secondary | ICD-10-CM | POA: Diagnosis not present

## 2023-06-19 DIAGNOSIS — I10 Essential (primary) hypertension: Secondary | ICD-10-CM | POA: Diagnosis not present

## 2023-06-19 DIAGNOSIS — Z0001 Encounter for general adult medical examination with abnormal findings: Secondary | ICD-10-CM | POA: Diagnosis not present

## 2023-06-19 DIAGNOSIS — H9313 Tinnitus, bilateral: Secondary | ICD-10-CM | POA: Diagnosis not present

## 2023-06-25 ENCOUNTER — Ambulatory Visit (HOSPITAL_COMMUNITY)
Admission: RE | Admit: 2023-06-25 | Discharge: 2023-06-25 | Disposition: A | Discharge status: Home / Self Care | Payer: Medicare Other | Source: Ambulatory Visit | Attending: Internal Medicine | Admitting: Internal Medicine

## 2023-06-25 DIAGNOSIS — M545 Low back pain, unspecified: Secondary | ICD-10-CM | POA: Diagnosis not present

## 2023-06-25 DIAGNOSIS — M5136 Other intervertebral disc degeneration, lumbar region with discogenic back pain only: Secondary | ICD-10-CM | POA: Diagnosis not present

## 2023-07-03 DIAGNOSIS — I10 Essential (primary) hypertension: Secondary | ICD-10-CM | POA: Diagnosis not present

## 2023-07-03 DIAGNOSIS — E782 Mixed hyperlipidemia: Secondary | ICD-10-CM | POA: Diagnosis not present

## 2023-07-29 DIAGNOSIS — D485 Neoplasm of uncertain behavior of skin: Secondary | ICD-10-CM | POA: Diagnosis not present

## 2023-07-29 DIAGNOSIS — D2271 Melanocytic nevi of right lower limb, including hip: Secondary | ICD-10-CM | POA: Diagnosis not present

## 2023-07-29 DIAGNOSIS — Z1283 Encounter for screening for malignant neoplasm of skin: Secondary | ICD-10-CM | POA: Diagnosis not present

## 2023-07-29 DIAGNOSIS — S01311A Laceration without foreign body of right ear, initial encounter: Secondary | ICD-10-CM | POA: Diagnosis not present

## 2023-07-29 DIAGNOSIS — D225 Melanocytic nevi of trunk: Secondary | ICD-10-CM | POA: Diagnosis not present

## 2023-07-29 DIAGNOSIS — B078 Other viral warts: Secondary | ICD-10-CM | POA: Diagnosis not present

## 2023-07-31 DIAGNOSIS — E782 Mixed hyperlipidemia: Secondary | ICD-10-CM | POA: Diagnosis not present

## 2023-07-31 DIAGNOSIS — I10 Essential (primary) hypertension: Secondary | ICD-10-CM | POA: Diagnosis not present

## 2023-08-31 DIAGNOSIS — E782 Mixed hyperlipidemia: Secondary | ICD-10-CM | POA: Diagnosis not present

## 2023-08-31 DIAGNOSIS — I10 Essential (primary) hypertension: Secondary | ICD-10-CM | POA: Diagnosis not present

## 2023-08-31 DIAGNOSIS — K219 Gastro-esophageal reflux disease without esophagitis: Secondary | ICD-10-CM | POA: Diagnosis not present

## 2023-09-14 DIAGNOSIS — H8110 Benign paroxysmal vertigo, unspecified ear: Secondary | ICD-10-CM | POA: Diagnosis not present

## 2023-09-14 DIAGNOSIS — R7309 Other abnormal glucose: Secondary | ICD-10-CM | POA: Diagnosis not present

## 2023-09-14 DIAGNOSIS — E559 Vitamin D deficiency, unspecified: Secondary | ICD-10-CM | POA: Diagnosis not present

## 2023-09-14 DIAGNOSIS — Z6823 Body mass index (BMI) 23.0-23.9, adult: Secondary | ICD-10-CM | POA: Diagnosis not present

## 2023-09-14 DIAGNOSIS — I10 Essential (primary) hypertension: Secondary | ICD-10-CM | POA: Diagnosis not present

## 2023-09-14 DIAGNOSIS — Z9229 Personal history of other drug therapy: Secondary | ICD-10-CM | POA: Diagnosis not present

## 2023-09-14 DIAGNOSIS — N390 Urinary tract infection, site not specified: Secondary | ICD-10-CM | POA: Diagnosis not present

## 2023-09-14 DIAGNOSIS — Z0001 Encounter for general adult medical examination with abnormal findings: Secondary | ICD-10-CM | POA: Diagnosis not present

## 2023-09-14 LAB — HM AWV

## 2023-09-30 DIAGNOSIS — E782 Mixed hyperlipidemia: Secondary | ICD-10-CM | POA: Diagnosis not present

## 2023-09-30 DIAGNOSIS — I10 Essential (primary) hypertension: Secondary | ICD-10-CM | POA: Diagnosis not present

## 2023-10-31 DIAGNOSIS — I10 Essential (primary) hypertension: Secondary | ICD-10-CM | POA: Diagnosis not present

## 2023-10-31 DIAGNOSIS — E782 Mixed hyperlipidemia: Secondary | ICD-10-CM | POA: Diagnosis not present

## 2023-11-30 DIAGNOSIS — E782 Mixed hyperlipidemia: Secondary | ICD-10-CM | POA: Diagnosis not present

## 2023-11-30 DIAGNOSIS — K219 Gastro-esophageal reflux disease without esophagitis: Secondary | ICD-10-CM | POA: Diagnosis not present

## 2023-11-30 DIAGNOSIS — I10 Essential (primary) hypertension: Secondary | ICD-10-CM | POA: Diagnosis not present

## 2023-12-31 DIAGNOSIS — I10 Essential (primary) hypertension: Secondary | ICD-10-CM | POA: Diagnosis not present

## 2023-12-31 DIAGNOSIS — E782 Mixed hyperlipidemia: Secondary | ICD-10-CM | POA: Diagnosis not present

## 2024-01-31 DIAGNOSIS — I10 Essential (primary) hypertension: Secondary | ICD-10-CM | POA: Diagnosis not present

## 2024-01-31 DIAGNOSIS — E782 Mixed hyperlipidemia: Secondary | ICD-10-CM | POA: Diagnosis not present

## 2024-03-21 ENCOUNTER — Ambulatory Visit: Payer: Self-pay

## 2024-03-21 ENCOUNTER — Ambulatory Visit
Admission: EM | Admit: 2024-03-21 | Discharge: 2024-03-21 | Disposition: A | Discharge status: Home / Self Care | Attending: Student | Admitting: Student

## 2024-03-21 DIAGNOSIS — R399 Unspecified symptoms and signs involving the genitourinary system: Secondary | ICD-10-CM | POA: Insufficient documentation

## 2024-03-21 DIAGNOSIS — N3000 Acute cystitis without hematuria: Secondary | ICD-10-CM | POA: Insufficient documentation

## 2024-03-21 LAB — POCT URINE DIPSTICK
Blood, UA: NEGATIVE
Glucose, UA: NEGATIVE mg/dL
Leukocytes, UA: NEGATIVE
Nitrite, UA: POSITIVE — AB
POC PROTEIN,UA: 100 — AB
Spec Grav, UA: 1.02 (ref 1.010–1.025)
Urobilinogen, UA: 1 U/dL
pH, UA: 5.5 (ref 5.0–8.0)

## 2024-03-21 MED ORDER — CEPHALEXIN 500 MG PO CAPS: 500.0000 mg | ORAL_CAPSULE | Freq: Two times a day (BID) | ORAL | 0 refills | Status: AC

## 2024-03-21 MED ORDER — FLUCONAZOLE 150 MG PO TABS: 150.0000 mg | ORAL_TABLET | Freq: Every day | ORAL | 0 refills | Status: DC

## 2024-03-21 NOTE — ED Provider Notes (Addendum)
 [facility]    CSN: [id number] Arrival date & time: 03/21/24  1126      History   Chief Complaint Chief Complaint  Patient presents with   Dysuria    HPI Jocelyn Love is a 78 y.o. female presenting with urinary symptoms.  Medical history arthritis, GERD, hyperlipidemia, ventral hernia, umbilical hernia. Notes weak stream, mild whole-body discomfort after urination. Noted blood on toilet paper 3 days ago (03/19/24) Today, denies hematuria, frequency, urgency.  Last azo was 10/18/235. Last UTI 2021. Denies h/o CKD. Family history bladder cancer in father. Her primary care provider unfortunately retired, and she does not have another PCP visit until 05/23/2024.  HPI  Past Medical History:  Diagnosis Date   Anemia    Arthritis    Chronic nausea    Clostridium difficile diarrhea 11/2016   treated with flagyl    Depression    GERD (gastroesophageal reflux disease)    Headache    HLD (hyperlipidemia)     Patient Active Problem List   Diagnosis Date Noted   Umbilical hernia, incarcerated 05/23/2021   Chronic diarrhea 12/05/2020   Dysphagia 09/10/2020   Ventral hernia without obstruction or gangrene 03/12/2020   Odynophagia 03/12/2020   History of adenomatous polyp of colon 11/03/2019   Hemorrhoids 11/03/2019   Rectal bleeding 11/03/2019   Hyperlipidemia 07/21/2018   Gastroesophageal reflux disease 07/21/2018   AVM (arteriovenous malformation) of small bowel, 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   Diarrhea 12/23/2016   Polyp of cecum    Polyp of sigmoid colon    Orthostatic hypotension 11/24/2016   Iron deficiency anemia 11/24/2016   Vertigo    BPV (benign positional vertigo) 11/23/2016   Depression 11/23/2016   Acute lower UTI 11/23/2016   Tongue sore 11/11/2016    Past Surgical History:  Procedure Laterality Date   ABDOMINAL HYSTERECTOMY     BIOPSY  01/20/2020   Procedure: BIOPSY;  Surgeon: Cindie Carlin POUR, DO;  Location: [facility];  Service:  Endoscopy;;  duodenal, antrum body   BIOPSY TONGUE     BLADDER SURGERY     BREAST SURGERY     augmentation   COLONOSCOPY N/A 11/26/2016   Procedure: COLONOSCOPY;  Surgeon: Harvey Margo CROME, MD;  10 mm cecal polyp removed piecemeal and treated with APC, 4 mm in the sigmoid colon, internal hemorrhoids.  Pathology with tubular adenoma in the cecum and hyperplastic polyp in the sigmoid.  Recommended repeat colonoscopy in 3 years.   COLONOSCOPY WITH PROPOFOL  N/A 01/20/2020   Procedure: COLONOSCOPY WITH PROPOFOL ;  Surgeon: Cindie Carlin POUR, DO; Nonbleeding internal hemorrhoids, few small diverticula in sigmoid colon, normal examined ileum, random colon biopsies taken (benign).  Recommended repeat colonoscopy in 5 years.   ESOPHAGOGASTRODUODENOSCOPY N/A 11/26/2016   Procedure: ESOPHAGOGASTRODUODENOSCOPY (EGD);  Surgeon: Harvey Margo CROME, MD;  Nonobstructing Schatzki ring, mild gastritis, gastric antral angiectasia, angiectasia in the second portion of duodenum.  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  No H. pylori, benign small bowel biopsies,   ESOPHAGOGASTRODUODENOSCOPY N/A 12/23/2016   Procedure: ESOPHAGOGASTRODUODENOSCOPY (EGD);  Surgeon: Harvey Margo CROME, MD;  Moderate Schatzki ring, mild gastritis, moderate postulcer deformity in the duodenal bulb, Givens capsule released into the duodenum.   ESOPHAGOGASTRODUODENOSCOPY (EGD) WITH PROPOFOL  N/A 01/20/2020   Procedure: ESOPHAGOGASTRODUODENOSCOPY (EGD) WITH PROPOFOL ;  Surgeon: Cindie Carlin POUR, DO; moderate Schatzki's ring s/p dilation, small hiatal hernia, diffuse inflammation in the entire stomach (gastritis, no H. pylori), normal examined duodenum s/p biopsy (benign).   GIVENS CAPSULE STUDY N/A 12/23/2016  Procedure: GIVENS CAPSULE STUDY;  Surgeon: Harvey Margo CROME, MD; small bowel AVMs   SAVORY DILATION N/A 11/26/2016   Procedure: SAVORY DILATION;  Surgeon: Harvey Margo CROME, MD;  Location: [facility];  Service: Endoscopy;  Laterality: N/A;    UMBILICAL HERNIA REPAIR N/A 06/10/2021   Procedure: HERNIA REPAIR UMBILICAL ADULT WITH MESH;  Surgeon: Kallie Manuelita BROCKS, MD;  Location: [facility];  Service: General;  Laterality: N/A;    OB History   No obstetric history on file.      Home Medications    Prior to Admission medications   Medication Sig Start Date End Date Taking? Authorizing Provider  cephALEXin (KEFLEX) 500 MG capsule Take 1 capsule (500 mg total) by mouth 2 (two) times daily for 7 days. 03/21/24 03/28/24 Yes Tiah Heckel E, PA-C  acetaminophen  (TYLENOL ) 500 MG tablet Take 1,000 mg by mouth every 6 (six) hours.    [provider]  amLODipine (NORVASC) 5 MG tablet Take 5 mg by mouth daily.    [provider]  DULoxetine (CYMBALTA) 30 MG capsule Take 30 mg by mouth daily.    [provider]  gabapentin (NEURONTIN) 300 MG capsule Take 300-600 mg by mouth See admin instructions. Take 600 mg in the morning and 300 mg in the evening    [provider]  lipase/protease/amylase (CREON) 36000 UNITS CPEP capsule Take 72,000 Units by mouth 3 (three) times daily with meals. 36000 with snack Patient not taking: Reported on 07/09/2021    [provider]  omeprazole  (PRILOSEC) 40 MG capsule TAKE ONE CAPSULE BY MOUTH EVERY MORNING 05/08/22   Carver, Charles K, DO  rosuvastatin (CRESTOR) 10 MG tablet Take 10 mg by mouth daily.    [provider]  sucralfate  (CARAFATE ) 1 g tablet Take 1 g by mouth 2 (two) times daily. 10/16/22   [provider]    Family History Family History  Problem Relation Age of Onset   Colon cancer Neg Hx     Social History Social History   Tobacco Use   Smoking status: Former    Types: Cigarettes   Smokeless tobacco: Never  Vaping Use   Vaping status: Never Used  Substance Use Topics   Alcohol use: Yes    Comment: occ glass of wine   Drug use: No     Allergies   Codeine and Sulfa antibiotics   Review of Systems Review of Systems   Constitutional:  Negative for appetite change, chills, diaphoresis and fever.  Respiratory:  Negative for shortness of breath.   Cardiovascular:  Negative for chest pain.  Gastrointestinal:  Negative for abdominal pain, blood in stool, constipation, diarrhea, nausea and vomiting.  Genitourinary:  Positive for difficulty urinating. Negative for decreased urine volume, dysuria, flank pain, frequency, genital sores, hematuria and urgency.  Musculoskeletal:  Negative for back pain.  Neurological:  Negative for dizziness, weakness and light-headedness.  All other systems reviewed and are negative.    Physical Exam Triage Vital Signs ED Triage Vitals [03/21/24 1224]  Encounter Vitals Group     BP 128/86     Girls Systolic BP Percentile      Girls Diastolic BP Percentile      Boys Systolic BP Percentile      Boys Diastolic BP Percentile      Pulse Rate 84     Resp 18     Temp 98.1 F (36.7 C)     Temp src      SpO2 94 %  Weight      Height      Head Circumference      Peak Flow      Pain Score 4     Pain Loc      Pain Education      Exclude from Growth Chart    No data found.  Updated Vital Signs BP 128/86   Pulse 84   Temp 98.1 F (36.7 C)   Resp 18   SpO2 94%   Visual Acuity Right Eye Distance:   Left Eye Distance:   Bilateral Distance:    Right Eye Near:   Left Eye Near:    Bilateral Near:     Physical Exam Vitals reviewed.  Constitutional:      General: She is not in acute distress.    Appearance: Normal appearance. She is not ill-appearing.  HENT:     Head: Normocephalic and atraumatic.     Mouth/Throat:     Mouth: Mucous membranes are moist.     Comments: Moist mucous membranes Eyes:     Extraocular Movements: Extraocular movements intact.     Pupils: Pupils are equal, round, and reactive to light.  Cardiovascular:     Rate and Rhythm: Normal rate and regular rhythm.     Heart sounds: Normal heart sounds.  Pulmonary:     Effort: Pulmonary  effort is normal.     Breath sounds: Normal breath sounds. No wheezing, rhonchi or rales.  Abdominal:     General: Bowel sounds are normal. There is no distension.     Palpations: Abdomen is soft. There is no mass.     Tenderness: There is no abdominal tenderness. There is no right CVA tenderness, left CVA tenderness, guarding or rebound.  Skin:    General: Skin is warm.     Capillary Refill: Capillary refill takes less than 2 seconds.     Comments: Good skin turgor  Neurological:     General: No focal deficit present.     Mental Status: She is alert and oriented to person, place, and time.  Psychiatric:        Mood and Affect: Mood normal.        Behavior: Behavior normal.      UC Treatments / Results  Labs (all labs ordered are listed, but only abnormal results are displayed) Labs Reviewed  POCT URINE DIPSTICK - Abnormal; Notable for the following components:      Result Value   Color, UA straw (*)    Bilirubin, UA moderate (*)    Ketones, POC UA small (15) (*)    POC PROTEIN,UA =100 (*)    Nitrite, UA Positive (*)    All other components within normal limits  URINE CULTURE    EKG   Radiology No results found.  Procedures Procedures (including critical care time)  Medications Ordered in UC Medications - No data to display  Initial Impression / Assessment and Plan / UC Course  I have reviewed the triage vital signs and the nursing notes.  Pertinent labs & imaging results that were available during my care of the patient were reviewed by me and considered in my medical decision making (see chart for details).     Patient is a 78 year old female presenting with acute cystitis.  She is afebrile, nontachycardic.  On exam, there is no suprapubic tenderness or CVAT, so there is not concern for pyelonephritis or complicated UTI.  UA with positive nitrite, negative leuk, negative blood.  Last Azo  was about 48 hours ago.  Culture sent.  Will manage as acute cystitis with  Keflex twice daily x 7 days.  Good hydration.  Azo for symptomatic relief.  Return precautions as below.  Diflucan sent to have on hand.  Final Clinical Impressions(s) / UC Diagnoses   Final diagnoses:  Urinary symptom or sign  Acute cystitis without hematuria     Discharge Instructions      -Keflex twice daily x7 days -Drink plenty of fluids -Okay to use Azo for symptomatic relief -Follow-up if symptoms worsen/persist: new abdominal pain, new/unusual back pain, new fevers, etc.     ED Prescriptions     Medication Sig Dispense Auth. Provider   cephALEXin (KEFLEX) 500 MG capsule Take 1 capsule (500 mg total) by mouth 2 (two) times daily for 7 days. 14 capsule Ankush Gintz E, PA-C      PDMP not reviewed this encounter.   Arlyss Leita BRAVO, PA-C 03/21/24 1304    Arlyss Leita BRAVO, PA-C 03/21/24 1332

## 2024-03-21 NOTE — Discharge Instructions (Signed)
-  Keflex twice daily x7 days -Drink plenty of fluids -Okay to use Azo for symptomatic relief -Follow-up if symptoms worsen/persist: new abdominal pain, new/unusual back pain, new fevers, etc.

## 2024-03-21 NOTE — ED Triage Notes (Signed)
 Pt present with c/o pain when voiding x one week. Pt states on Saturday she noticed  a string of blood  in her urine. Pt states today she feelsfine

## 2024-03-21 NOTE — Telephone Encounter (Signed)
 FYI Only or Action Required?: FYI only for provider.  Called Nurse Triage reporting Urinary Incontinence.  Symptoms began several days ago.  Interventions attempted: OTC medications: AZO.  Symptoms are: unchanged.  Triage Disposition: See Physician Within 24 Hours  Patient/caregiver understands and will follow disposition?: Yes, will follow disposition  Copied from CRM #[id number]. Topic: Clinical - Red Word Triage >> Mar 21, 2024  9:51 AM Treva T wrote: [facility] that prompted transfer to Nurse Triage: Patient reports she having symptoms of UTI, painful with urination, with also a small amount of blood on tissue when wiped.   Patient also has taken medication over the counter, AZO, took that twice on Saturday, 03/19/24, and it helped for a bit, however symptoms has returned.   Patient states she is concerned as her father was diagnosed with bladder cancer, and wants to be evaluated as soon as possible regarding her symptoms. Reason for Disposition  Urinating more frequently than usual (i.e., frequency) OR new-onset of the feeling of an urgent need to urinate (i.e., urgency)  Answer Assessment - Initial Assessment Questions 1. SYMPTOM: What's the main symptom you're concerned about? (e.g., frequency, incontinence)     Painful urination, blood after urinating 2. ONSET: When did the  pain  start?     3 days 3. PAIN: Is there any pain? If Yes, ask: How bad is it? (Scale: 1-10; mild, moderate, severe)     When it hurts it hurts pretty bad, 7-8 maybe 4. CAUSE: What do you think is causing the symptoms?     Pt concerned that she has these s/s and family hx of bladder cancer 5. OTHER SYMPTOMS: Do you have any other symptoms? (e.g., blood in urine, fever, flank pain, pain with urination)     Blood on tissue when wiping after voiding, denies fever, denies cloudy urine.  Pt advised to go to UC as she currently does not have a PCP.  Protocols used: Urinary Symptoms-A-AH

## 2024-03-23 ENCOUNTER — Ambulatory Visit (HOSPITAL_COMMUNITY): Payer: Self-pay

## 2024-03-23 LAB — URINE CULTURE: Culture: NO GROWTH

## 2024-05-23 ENCOUNTER — Ambulatory Visit: Admitting: Physician Assistant

## 2024-05-23 ENCOUNTER — Encounter: Payer: Self-pay | Admitting: Physician Assistant

## 2024-05-23 VITALS — BP 133/83 | HR 85 | Temp 98.1°F | Ht 62.0 in | Wt 117.1 lb

## 2024-05-23 DIAGNOSIS — M542 Cervicalgia: Secondary | ICD-10-CM | POA: Diagnosis not present

## 2024-05-23 DIAGNOSIS — Z23 Encounter for immunization: Secondary | ICD-10-CM | POA: Diagnosis not present

## 2024-05-23 DIAGNOSIS — R7303 Prediabetes: Secondary | ICD-10-CM | POA: Insufficient documentation

## 2024-05-23 DIAGNOSIS — Z7689 Persons encountering health services in other specified circumstances: Secondary | ICD-10-CM

## 2024-05-23 DIAGNOSIS — E785 Hyperlipidemia, unspecified: Secondary | ICD-10-CM | POA: Diagnosis not present

## 2024-05-23 DIAGNOSIS — K219 Gastro-esophageal reflux disease without esophagitis: Secondary | ICD-10-CM | POA: Diagnosis not present

## 2024-05-23 DIAGNOSIS — I10 Essential (primary) hypertension: Secondary | ICD-10-CM | POA: Insufficient documentation

## 2024-05-23 MED ORDER — ROSUVASTATIN CALCIUM 20 MG PO TABS: 20.0000 mg | ORAL_TABLET | Freq: Every day | ORAL | 3 refills | Status: AC

## 2024-05-23 NOTE — Progress Notes (Signed)
 "  New Patient Office Visit  Subjective    Patient ID: Jocelyn Love, female    DOB: September 05, 1945  Age: 78 y.o. MRN: [id number]  CC:  Chief Complaint  Patient presents with   New Patient (Initial Visit)    Patient is a new patient Patient is wanting to get back on ozempic.  Patient states she has been having feet swelling from not being on it. Stated it helped     HPI Jocelyn Love presents to establish care  Discussed the use of AI scribe software for clinical note transcription with the patient, who gave verbal consent to proceed.  History of Present Illness Jocelyn Love is a 78 year old female who presents for establishment of care and medication management.  She is seeking to resume Ozempic, which she previously took for prediabetes. She feels it improved her blood pressure, leg and ankle swelling, and weight. She has not had recent labs to reassess prediabetes.  She has had right-sided neck pain for 3 weeks, described as soreness and stiffness from the spine to the right side. Pain worsens with certain movements. She is using ibuprofen  and Tylenol  for relief.  She is adjusting to new hearing aids and has difficulty with background noise.  Current medications include gabapentin, duloxetine, amlodipine, pantoprazole , and sucralfate . She stopped omeprazole . She needs a refill of Crestor  for high cholesterol.  She has esophageal narrowing with difficulty swallowing bread, rice, and mashed potatoes, which she manages with pantoprazole  and sucralfate .   Outpatient Encounter Medications as of 05/23/2024  Medication Sig   acetaminophen  (TYLENOL ) 500 MG tablet Take 1,000 mg by mouth every 6 (six) hours.   amLODipine (NORVASC) 5 MG tablet Take 5 mg by mouth daily.   DULoxetine (CYMBALTA) 30 MG capsule Take 30 mg by mouth daily.   gabapentin (NEURONTIN) 300 MG capsule Take 300-600 mg by mouth See admin instructions. Take 600 mg in the morning and 300 mg in the evening    rosuvastatin  (CRESTOR ) 20 MG tablet Take 1 tablet (20 mg total) by mouth daily.   sucralfate  (CARAFATE ) 1 g tablet Take 1 g by mouth 2 (two) times daily.   [DISCONTINUED] omeprazole  (PRILOSEC) 40 MG capsule TAKE ONE CAPSULE BY MOUTH EVERY MORNING   [DISCONTINUED] fluconazole  (DIFLUCAN ) 150 MG tablet Take 1 tablet (150 mg total) by mouth daily. -For your yeast infection, start the Diflucan  (fluconazole )- Take one pill today (day 1). If you're still having symptoms in 3 days, take the second pill. (Patient not taking: Reported on 05/23/2024)   [DISCONTINUED] lipase/protease/amylase (CREON) 36000 UNITS CPEP capsule Take 72,000 Units by mouth 3 (three) times daily with meals. 36000 with snack (Patient not taking: Reported on 07/09/2021)   [DISCONTINUED] rosuvastatin  (CRESTOR ) 10 MG tablet Take 10 mg by mouth daily. (Patient not taking: Reported on 05/23/2024)   No facility-administered encounter medications on file as of 05/23/2024.    Past Medical History:  Diagnosis Date   Anemia    Arthritis    Chronic nausea    Clostridium difficile diarrhea 11/2016   treated with flagyl    Depression    GERD (gastroesophageal reflux disease)    Headache    HLD (hyperlipidemia)     Past Surgical History:  Procedure Laterality Date   BIOPSY  01/20/2020   Procedure: BIOPSY;  Surgeon: Cindie Carlin POUR, DO;  Location: [facility];  Service: Endoscopy;;  duodenal, antrum body   BIOPSY TONGUE     BLADDER SURGERY     BREAST  SURGERY     augmentation   COLONOSCOPY N/A 11/26/2016   Procedure: COLONOSCOPY;  Surgeon: Harvey Margo CROME, MD;  10 mm cecal polyp removed piecemeal and treated with APC, 4 mm in the sigmoid colon, internal hemorrhoids.  Pathology with tubular adenoma in the cecum and hyperplastic polyp in the sigmoid.  Recommended repeat colonoscopy in 3 years.   COLONOSCOPY WITH PROPOFOL  N/A 01/20/2020   Procedure: COLONOSCOPY WITH PROPOFOL ;  Surgeon: Cindie Carlin POUR, DO; Nonbleeding internal  hemorrhoids, few small diverticula in sigmoid colon, normal examined ileum, random colon biopsies taken (benign).  Recommended repeat colonoscopy in 5 years.   ESOPHAGOGASTRODUODENOSCOPY N/A 11/26/2016   Procedure: ESOPHAGOGASTRODUODENOSCOPY (EGD);  Surgeon: Harvey Margo CROME, MD;  Nonobstructing Schatzki ring, mild gastritis, gastric antral angiectasia, angiectasia in the second portion of duodenum.  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  No H. pylori, benign small bowel biopsies,   ESOPHAGOGASTRODUODENOSCOPY N/A 12/23/2016   Procedure: ESOPHAGOGASTRODUODENOSCOPY (EGD);  Surgeon: Harvey Margo CROME, MD;  Moderate Schatzki ring, mild gastritis, moderate postulcer deformity in the duodenal bulb, Givens capsule released into the duodenum.   ESOPHAGOGASTRODUODENOSCOPY (EGD) WITH PROPOFOL  N/A 01/20/2020   Procedure: ESOPHAGOGASTRODUODENOSCOPY (EGD) WITH PROPOFOL ;  Surgeon: Cindie Carlin POUR, DO; moderate Schatzki's ring s/p dilation, small hiatal hernia, diffuse inflammation in the entire stomach (gastritis, no H. pylori), normal examined duodenum s/p biopsy (benign).   GIVENS CAPSULE STUDY N/A 12/23/2016   Procedure: GIVENS CAPSULE STUDY;  Surgeon: Harvey Margo CROME, MD; small bowel AVMs   SAVORY DILATION N/A 11/26/2016   Procedure: SAVORY DILATION;  Surgeon: Harvey Margo CROME, MD;  Location: [facility];  Service: Endoscopy;  Laterality: N/A;   TOTAL ABDOMINAL HYSTERECTOMY     UMBILICAL HERNIA REPAIR N/A 06/10/2021   Procedure: HERNIA REPAIR UMBILICAL ADULT WITH MESH;  Surgeon: Kallie Manuelita BROCKS, MD;  Location: [facility];  Service: General;  Laterality: N/A;    Family History  Problem Relation Age of Onset   Colon cancer Neg Hx     Social History   Socioeconomic History   Marital status: Widowed    Spouse name: Not on file   Number of children: Not on file   Years of education: Not on file   Highest education level: Not on file  Occupational History   Not on file  Tobacco Use   Smoking  status: Former    Types: Cigarettes   Smokeless tobacco: Never  Vaping Use   Vaping status: Never Used  Substance and Sexual Activity   Alcohol use: Yes    Comment: occ glass of wine   Drug use: No   Sexual activity: Not on file  Other Topics Concern   Not on file  Social History Narrative   Not on file   Social Drivers of Health   Tobacco Use: Medium Risk (03/21/2024)   Patient History    Smoking Tobacco Use: Former    Smokeless Tobacco Use: Never    Passive Exposure: Not on Actuary Strain: Not on file  Food Insecurity: Not on file  Transportation Needs: Not on file  Physical Activity: Not on file  Stress: Not on file  Social Connections: Not on file  Intimate Partner Violence: Not on file  Depression (EYV7-0): Not on file  Alcohol Screen: Not on file  Housing: Not on file  Utilities: Not on file  Health Literacy: Not on file    Review of Systems  Constitutional:  Negative for appetite change, fatigue and fever.  Eyes:  Negative  for visual disturbance.  Respiratory:  Negative for cough and shortness of breath.   Cardiovascular:  Positive for leg swelling. Negative for chest pain.  Musculoskeletal:  Positive for back pain. Negative for gait problem and myalgias.  Neurological:  Negative for light-headedness and headaches.         Objective    BP 133/83 (BP Location: Left Arm, Patient Position: Sitting)   Pulse 85   Temp 98.1 F (36.7 C)   Ht 5' 2 (1.575 m)   Wt 117 lb 2 oz (53.1 kg)   SpO2 95%   BMI 21.42 kg/m   Physical Exam Constitutional:      General: She is not in acute distress.    Appearance: Normal appearance. She is normal weight. She is not ill-appearing.  HENT:     Head: Normocephalic and atraumatic.     Mouth/Throat:     Mouth: Mucous membranes are moist.     Pharynx: Oropharynx is clear.  Eyes:     Extraocular Movements: Extraocular movements intact.     Conjunctiva/sclera: Conjunctivae normal.  Cardiovascular:      Rate and Rhythm: Normal rate and regular rhythm.     Heart sounds: Normal heart sounds. No murmur heard. Pulmonary:     Effort: Pulmonary effort is normal.     Breath sounds: Normal breath sounds. No wheezing, rhonchi or rales.  Musculoskeletal:     Right lower leg: No edema.     Left lower leg: No edema.  Skin:    General: Skin is warm and dry.  Neurological:     General: No focal deficit present.     Mental Status: She is alert and oriented to person, place, and time.  Psychiatric:        Mood and Affect: Mood normal.        Behavior: Behavior normal.       Assessment & Plan:  Encounter to establish care  Prediabetes Assessment & Plan: Previously managed with Ozempic, effective for weight loss and reportedly blood pressure control. Currently off due to prescription issues. Discusses insurance coverage challenging as Ozempic is primarily covered for type 2 diabetes. - Review records [facility] for previous patient assistance for Ozempic. - Explore insurance options for Ozempic coverage, potentially coding under prediabetes. - Ordered lab work to assess current status of prediabetes. - Will follow up with Ozempic refill pending chart review and lab results, however concern for weight as patient is 117 pounds currently.   Orders: -     Hemoglobin A1c  Primary hypertension Assessment & Plan: 133/83 Controlled. Continue current medications. No change in management. Discussed DASH diet and dietary sodium restrictions.  Continue dietary efforts and physical activity. Labs ordered today.   Orders: -     Lipid panel -     CMP14+EGFR -     CBC with Differential/Platelet  Hyperlipidemia, unspecified hyperlipidemia type Assessment & Plan: Patient with history of hyperlipidemia, has been off Crestor  for some time. Lipid panel today. Medication refilled. Continue healthy diet and lifestyle.   Orders: -     Lipid panel -     Rosuvastatin  Calcium ; Take 1 tablet (20 mg  total) by mouth daily.  Dispense: 90 tablet; Refill: 3  Gastroesophageal reflux disease without esophagitis Assessment & Plan: Managed with pantoprazole  and sucralfate . Symptoms include esophageal narrowing with certain foods. - Discontinued omeprazole . - Continue pantoprazole  and sucralfate . - Advised taking pantoprazole  in the morning on an empty stomach for better efficacy.   Neck pain on  right side Assessment & Plan: Recent onset of right-sided neck pain with soreness and stiffness, exacerbated by certain movements. - Continue ibuprofen  and Tylenol  as needed for pain management.   Immunization due -     Flu vaccine HIGH DOSE PF(Fluzone Trivalent)   Return in about 4 months (around 09/21/2024) for AWV/phys .   Dreana Britz, PA-C   "

## 2024-05-23 NOTE — Assessment & Plan Note (Signed)
 Patient with history of hyperlipidemia, has been off Crestor  for some time. Lipid panel today. Medication refilled. Continue healthy diet and lifestyle.

## 2024-05-23 NOTE — Assessment & Plan Note (Signed)
 133/83 Controlled. Continue current medications. No change in management. Discussed DASH diet and dietary sodium restrictions.  Continue dietary efforts and physical activity. Labs ordered today.

## 2024-05-23 NOTE — Assessment & Plan Note (Signed)
 Previously managed with Ozempic, effective for weight loss and reportedly blood pressure control. Currently off due to prescription issues. Discusses insurance coverage challenging as Ozempic is primarily covered for type 2 diabetes. - Review records from [facility] for previous patient assistance for Ozempic. - Explore insurance options for Ozempic coverage, potentially coding under prediabetes. - Ordered lab work to assess current status of prediabetes. - Will follow up with Ozempic refill pending chart review and lab results, however concern for weight as patient is 117 pounds currently.

## 2024-05-23 NOTE — Assessment & Plan Note (Signed)
 Recent onset of right-sided neck pain with soreness and stiffness, exacerbated by certain movements. - Continue ibuprofen  and Tylenol  as needed for pain management.

## 2024-05-23 NOTE — Assessment & Plan Note (Signed)
 Managed with pantoprazole  and sucralfate . Symptoms include esophageal narrowing with certain foods. - Discontinued omeprazole . - Continue pantoprazole  and sucralfate . - Advised taking pantoprazole  in the morning on an empty stomach for better efficacy.

## 2024-05-24 ENCOUNTER — Ambulatory Visit: Payer: Self-pay | Admitting: Physician Assistant

## 2024-05-24 LAB — CBC WITH DIFFERENTIAL/PLATELET
Basophils Absolute: 0.1 x10E3/uL (ref 0.0–0.2)
Basos: 1 %
EOS (ABSOLUTE): 0.3 x10E3/uL (ref 0.0–0.4)
Eos: 3 %
Hematocrit: 39.7 % (ref 34.0–46.6)
Hemoglobin: 12.6 g/dL (ref 11.1–15.9)
Immature Grans (Abs): 0 x10E3/uL (ref 0.0–0.1)
Immature Granulocytes: 0 %
Lymphocytes Absolute: 3.1 x10E3/uL (ref 0.7–3.1)
Lymphs: 37 %
MCH: 29.3 pg (ref 26.6–33.0)
MCHC: 31.7 g/dL (ref 31.5–35.7)
MCV: 92 fL (ref 79–97)
Monocytes Absolute: 0.8 x10E3/uL (ref 0.1–0.9)
Monocytes: 9 %
Neutrophils Absolute: 4.2 x10E3/uL (ref 1.4–7.0)
Neutrophils: 50 %
Platelets: 406 x10E3/uL (ref 150–450)
RBC: 4.3 x10E6/uL (ref 3.77–5.28)
RDW: 12.6 % (ref 11.7–15.4)
WBC: 8.4 x10E3/uL (ref 3.4–10.8)

## 2024-05-24 LAB — CMP14+EGFR
ALT: 13 IU/L (ref 0–32)
AST: 15 IU/L (ref 0–40)
Albumin: 4.5 g/dL (ref 3.8–4.8)
Alkaline Phosphatase: 72 IU/L (ref 49–135)
BUN/Creatinine Ratio: 25 (ref 12–28)
BUN: 15 mg/dL (ref 8–27)
Bilirubin Total: 0.2 mg/dL (ref 0.0–1.2)
CO2: 23 mmol/L (ref 20–29)
Calcium: 9.5 mg/dL (ref 8.7–10.3)
Chloride: 99 mmol/L (ref 96–106)
Creatinine, Ser: 0.6 mg/dL (ref 0.57–1.00)
Globulin, Total: 2.8 g/dL (ref 1.5–4.5)
Glucose: 103 mg/dL — ABNORMAL HIGH (ref 70–99)
Potassium: 4.5 mmol/L (ref 3.5–5.2)
Sodium: 138 mmol/L (ref 134–144)
Total Protein: 7.3 g/dL (ref 6.0–8.5)
eGFR: 92 mL/min/1.73

## 2024-05-24 LAB — LIPID PANEL
Chol/HDL Ratio: 3.4 ratio (ref 0.0–4.4)
Cholesterol, Total: 236 mg/dL — ABNORMAL HIGH (ref 100–199)
HDL: 70 mg/dL
LDL Chol Calc (NIH): 143 mg/dL — ABNORMAL HIGH (ref 0–99)
Triglycerides: 129 mg/dL (ref 0–149)
VLDL Cholesterol Cal: 23 mg/dL (ref 5–40)

## 2024-05-24 LAB — HEMOGLOBIN A1C
Est. average glucose Bld gHb Est-mCnc: 105 mg/dL
Hgb A1c MFr Bld: 5.3 % (ref 4.8–5.6)
